# Patient Record
Sex: Male | Born: 1990 | Race: White | Hispanic: No | Marital: Single | Smoking: Never smoker
Health system: Southern US, Community
[De-identification: ages and names within clinical notes are randomized; demographics above are authoritative.]

## PROBLEM LIST (undated history)

## (undated) DIAGNOSIS — F988 Other specified behavioral and emotional disorders with onset usually occurring in childhood and adolescence: Secondary | ICD-10-CM

## (undated) DIAGNOSIS — H3552 Pigmentary retinal dystrophy: Secondary | ICD-10-CM

## (undated) DIAGNOSIS — R7303 Prediabetes: Secondary | ICD-10-CM

## (undated) DIAGNOSIS — Z9989 Dependence on other enabling machines and devices: Secondary | ICD-10-CM

## (undated) DIAGNOSIS — G4730 Sleep apnea, unspecified: Secondary | ICD-10-CM

## (undated) DIAGNOSIS — E669 Obesity, unspecified: Secondary | ICD-10-CM

## (undated) DIAGNOSIS — G4733 Obstructive sleep apnea (adult) (pediatric): Secondary | ICD-10-CM

## (undated) DIAGNOSIS — K760 Fatty (change of) liver, not elsewhere classified: Secondary | ICD-10-CM

## (undated) HISTORY — DX: Pigmentary retinal dystrophy: H35.52

## (undated) HISTORY — DX: Fatty (change of) liver, not elsewhere classified: K76.0

## (undated) HISTORY — PX: TYMPANOSTOMY: SHX2586

## (undated) HISTORY — PX: WRIST FRACTURE SURGERY: SHX121

## (undated) HISTORY — DX: Dependence on other enabling machines and devices: Z99.89

## (undated) HISTORY — DX: Sleep apnea, unspecified: G47.30

## (undated) HISTORY — DX: Other specified behavioral and emotional disorders with onset usually occurring in childhood and adolescence: F98.8

## (undated) HISTORY — DX: Prediabetes: R73.03

## (undated) HISTORY — PX: WISDOM TOOTH EXTRACTION: SHX21

## (undated) HISTORY — DX: Obesity, unspecified: E66.9

## (undated) HISTORY — DX: Obstructive sleep apnea (adult) (pediatric): G47.33

---
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
## 2011-11-04 ENCOUNTER — Other Ambulatory Visit (HOSPITAL_COMMUNITY): Payer: Self-pay | Admitting: Internal Medicine

## 2011-11-07 ENCOUNTER — Ambulatory Visit (HOSPITAL_COMMUNITY)
Admission: RE | Admit: 2011-11-07 | Discharge: 2011-11-07 | Disposition: A | Discharge status: Home / Self Care | Payer: Commercial Managed Care - PPO | Source: Ambulatory Visit | Attending: Internal Medicine | Admitting: Internal Medicine

## 2011-11-07 DIAGNOSIS — R7402 Elevation of levels of lactic acid dehydrogenase (LDH): Secondary | ICD-10-CM | POA: Insufficient documentation

## 2011-11-07 DIAGNOSIS — K7689 Other specified diseases of liver: Secondary | ICD-10-CM | POA: Insufficient documentation

## 2011-11-07 DIAGNOSIS — D739 Disease of spleen, unspecified: Secondary | ICD-10-CM | POA: Insufficient documentation

## 2011-11-07 DIAGNOSIS — R7401 Elevation of levels of liver transaminase levels: Secondary | ICD-10-CM | POA: Insufficient documentation

## 2011-11-08 ENCOUNTER — Other Ambulatory Visit (HOSPITAL_COMMUNITY): Payer: Self-pay | Admitting: Internal Medicine

## 2011-11-08 ENCOUNTER — Ambulatory Visit (HOSPITAL_COMMUNITY): Payer: Self-pay

## 2011-11-08 DIAGNOSIS — C261 Malignant neoplasm of spleen: Secondary | ICD-10-CM

## 2011-12-13 ENCOUNTER — Ambulatory Visit (HOSPITAL_BASED_OUTPATIENT_CLINIC_OR_DEPARTMENT_OTHER): Payer: Commercial Managed Care - PPO

## 2012-04-03 ENCOUNTER — Ambulatory Visit (HOSPITAL_COMMUNITY)
Admission: RE | Admit: 2012-04-03 | Discharge: 2012-04-03 | Disposition: A | Discharge status: Home / Self Care | Payer: Commercial Managed Care - PPO | Source: Ambulatory Visit | Attending: Internal Medicine | Admitting: Internal Medicine

## 2012-04-03 DIAGNOSIS — K7689 Other specified diseases of liver: Secondary | ICD-10-CM | POA: Insufficient documentation

## 2012-04-03 DIAGNOSIS — D739 Disease of spleen, unspecified: Secondary | ICD-10-CM | POA: Insufficient documentation

## 2012-04-15 DIAGNOSIS — G4733 Obstructive sleep apnea (adult) (pediatric): Secondary | ICD-10-CM

## 2012-04-15 HISTORY — DX: Obstructive sleep apnea (adult) (pediatric): G47.33

## 2012-10-25 IMAGING — US US ABDOMEN COMPLETE
1 series · 14 of 25 positions shown · non-contrast
Comparison: None.

CLINICAL DATA: Elevated LFT

ABDOMINAL ULTRASOUND COMPLETE

[Series 1: us abdomen complete · 14 of 78 slices shown]
[im 1/78]
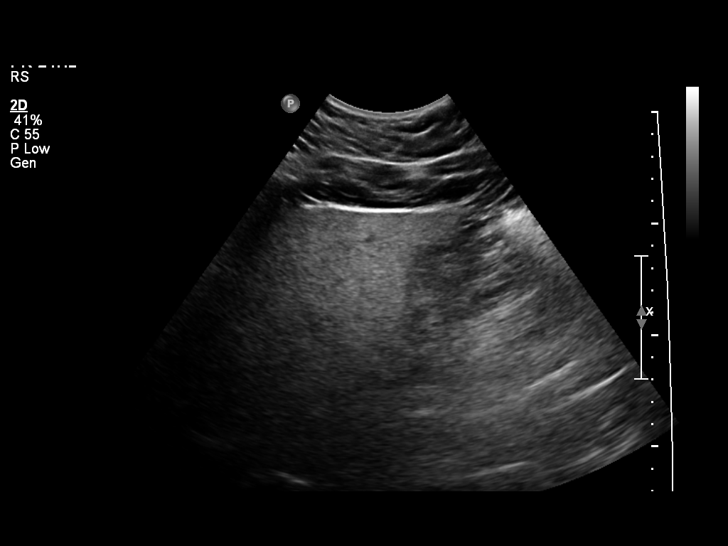
[im 7/78]
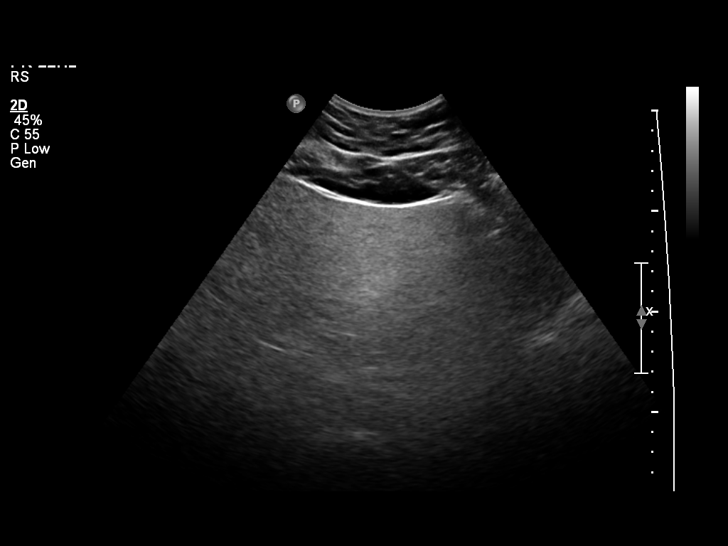
[im 13/78]
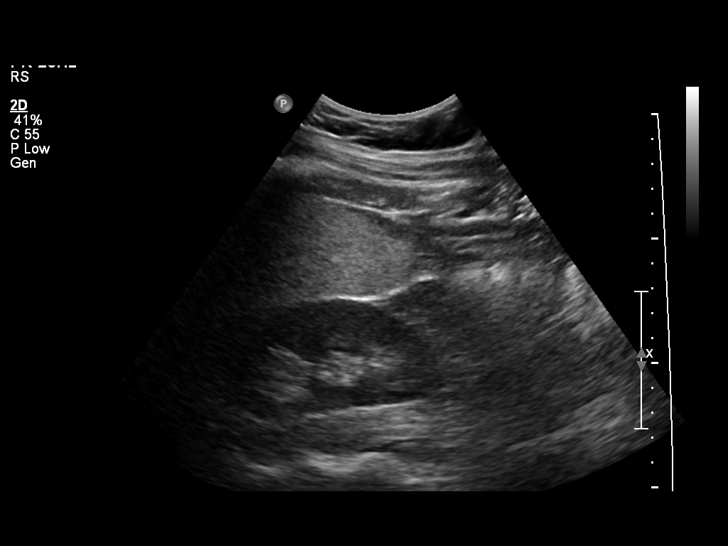
[im 20/78]
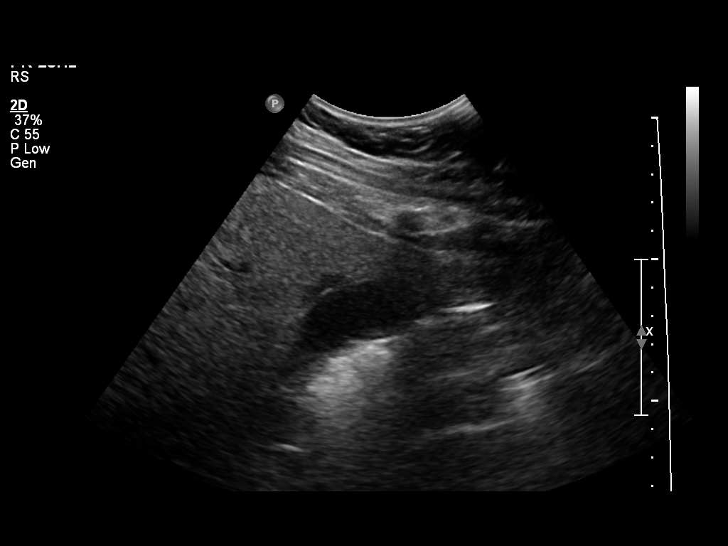
[im 26/78]
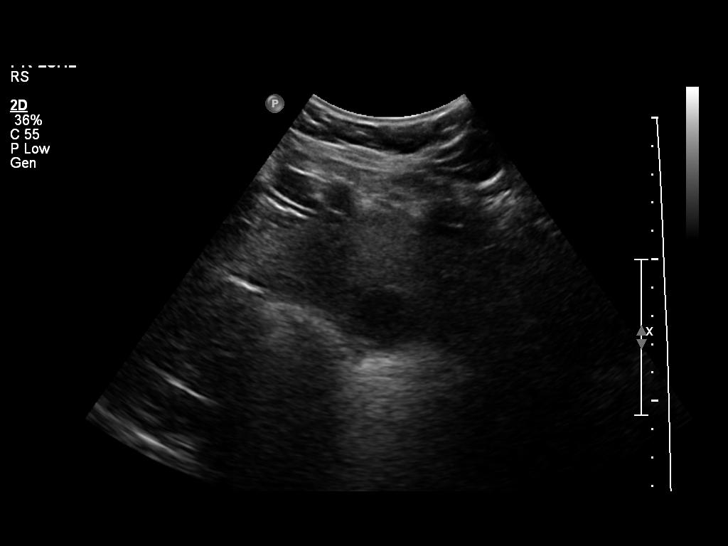
[im 29/78]
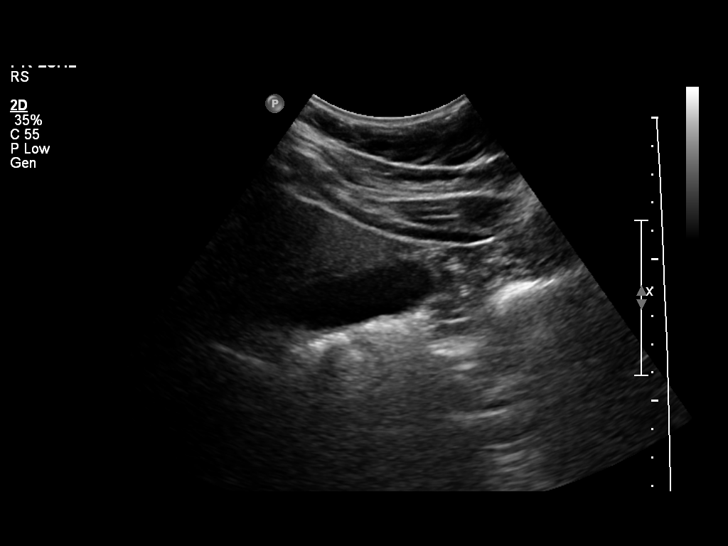
[im 36/78]
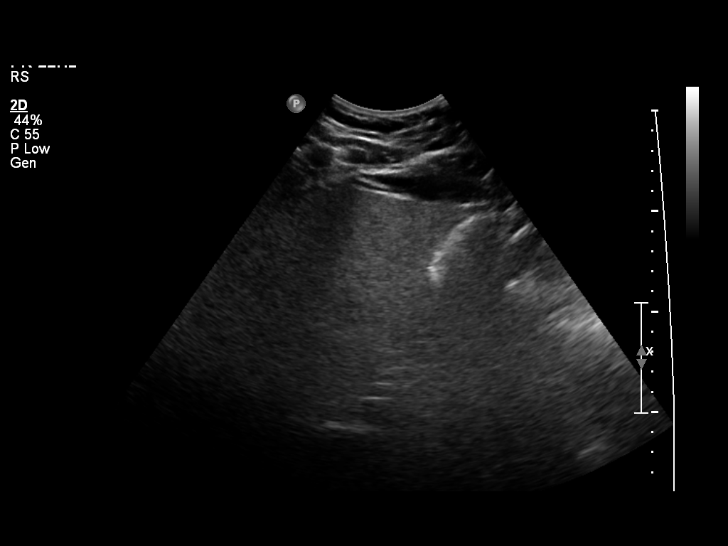
[im 42/78]
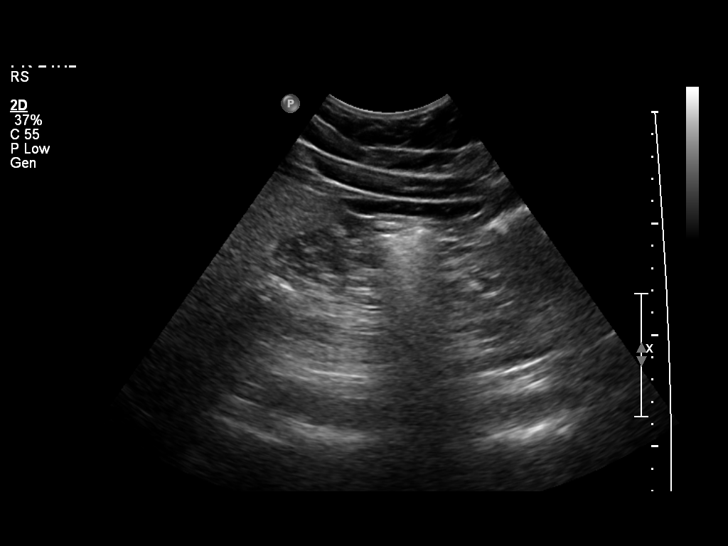
[im 49/78]
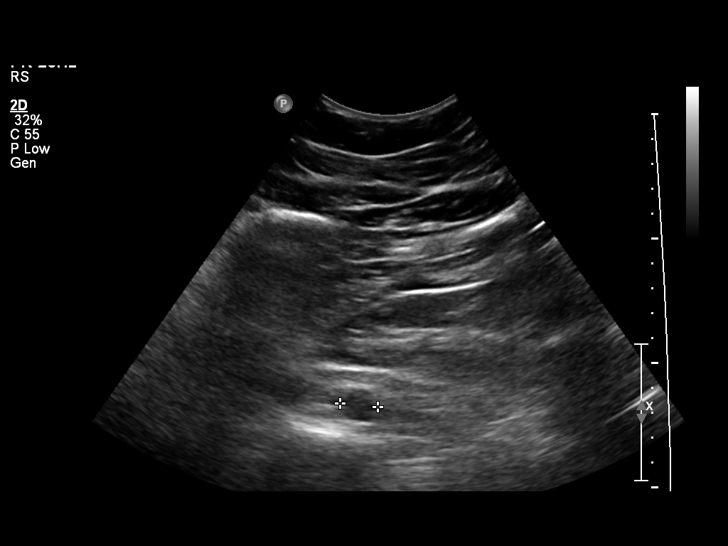
[im 52/78]
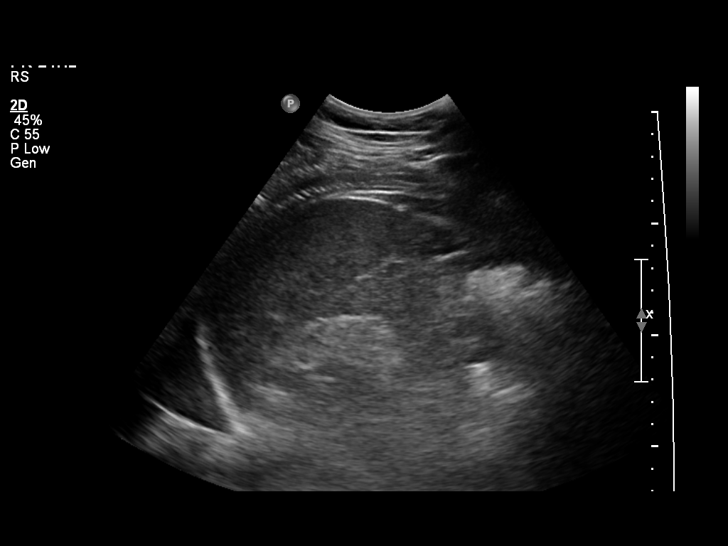
[im 58/78]
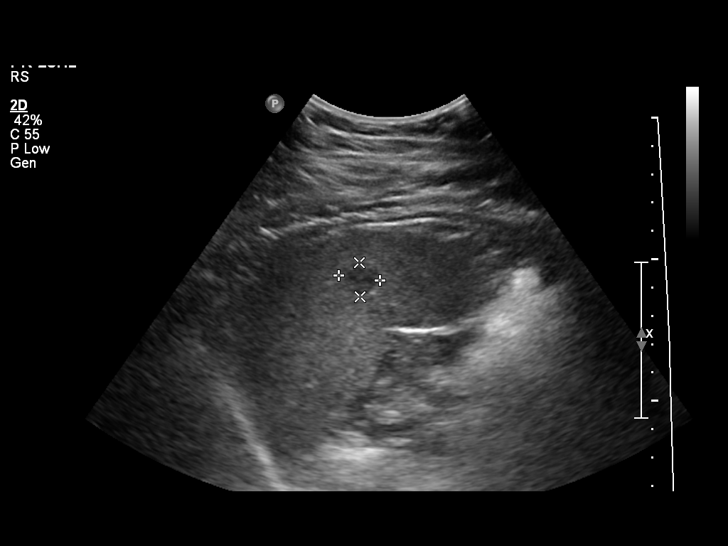
[im 65/78]
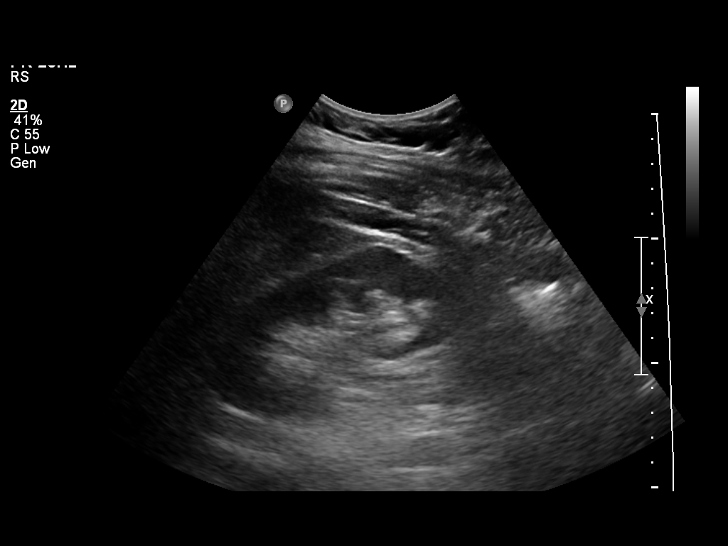
[im 71/78]
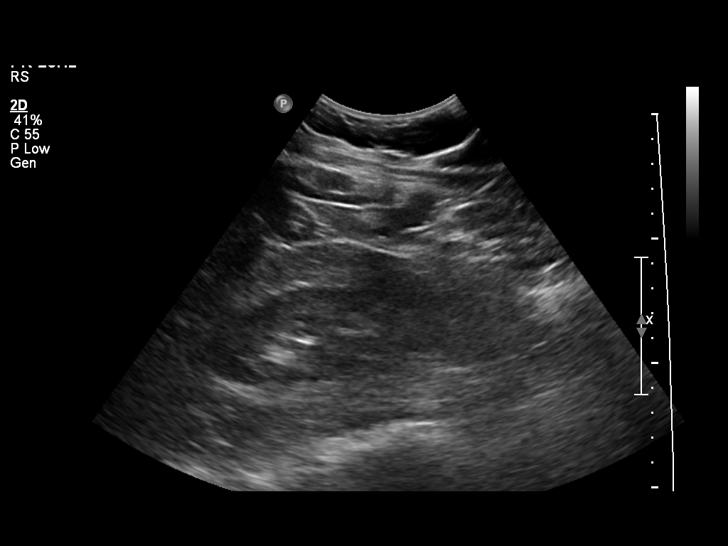
[im 78/78]
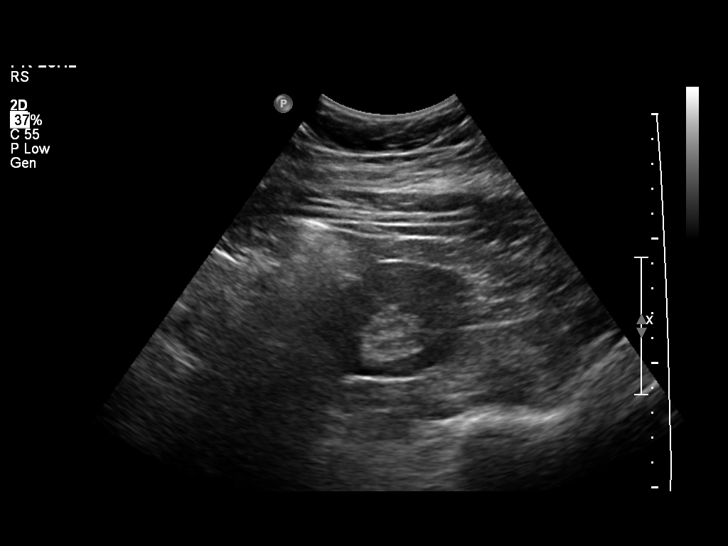

[14 of 25 positions shown; findings below may reference images not displayed]

FINDINGS: Gallbladder:  No gallstones, gallbladder wall thickening, or
pericholecystic fluid.

Common Bile Duct:  Within normal limits in caliber.

Liver: There is diffuse fatty infiltration of the liver.  No focal
mass lesion.

IVC:  Appears normal.

Pancreas:  No abnormality identified.

Spleen:  Measures 11.8 cm.  Indeterminant solid appearing structure
within the splenic parenchyma measures 1.4 x 1.2 cm.

Right kidney:  Normal in size and parenchymal echogenicity.  No
evidence of mass or hydronephrosis.

Left kidney:  Normal in size and parenchymal echogenicity.  No
evidence of mass or hydronephrosis.

Abdominal Aorta:  No aneurysm identified.
IMPRESSION: 1.  No acute findings.
2.  Fatty infiltration the liver.
3.  Indeterminant solid lesion within the splenic parenchyma
measures 1.4 cm.  Solitary splenic lesions are most often benign
lesions.If clinically indicated a more definitive assessment of
this structure may be obtained with contrast enhanced MRI.

## 2013-02-24 ENCOUNTER — Telehealth: Payer: Self-pay | Admitting: *Deleted

## 2013-02-24 NOTE — Telephone Encounter (Signed)
DOB:1990-12-29   RX= RITLIAN LA 30MG  TID PRN  Call when ready , chart being sent back

## 2013-03-08 ENCOUNTER — Encounter: Payer: Self-pay | Admitting: Internal Medicine

## 2013-03-08 DIAGNOSIS — R7303 Prediabetes: Secondary | ICD-10-CM | POA: Insufficient documentation

## 2013-03-08 DIAGNOSIS — E119 Type 2 diabetes mellitus without complications: Secondary | ICD-10-CM | POA: Insufficient documentation

## 2013-03-08 DIAGNOSIS — F988 Other specified behavioral and emotional disorders with onset usually occurring in childhood and adolescence: Secondary | ICD-10-CM | POA: Insufficient documentation

## 2013-03-09 ENCOUNTER — Ambulatory Visit: Payer: Self-pay | Admitting: Emergency Medicine

## 2013-03-22 IMAGING — US US ABDOMEN COMPLETE
1 series · 14 of 25 positions shown · non-contrast
Comparison: 11/07/2011.

CLINICAL DATA: Splenic lesion.

COMPLETE ABDOMINAL ULTRASOUND

[Series 1: us abdomen complete · 14 of 92 slices shown]
[im 1/92]
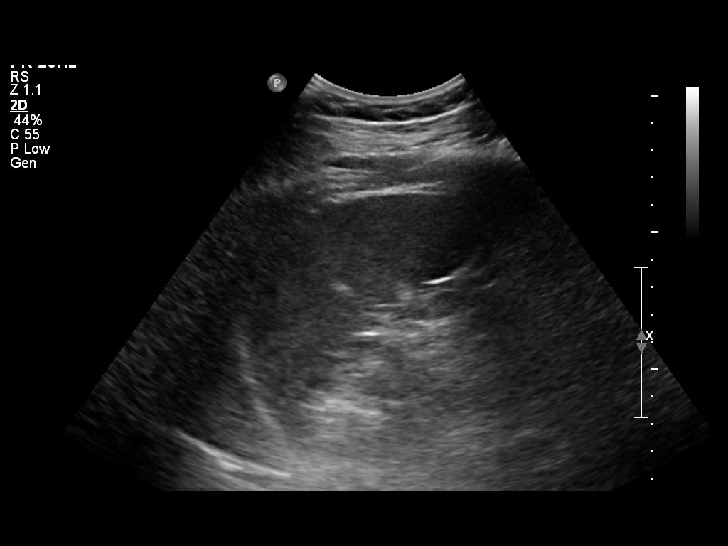
[im 8/92]
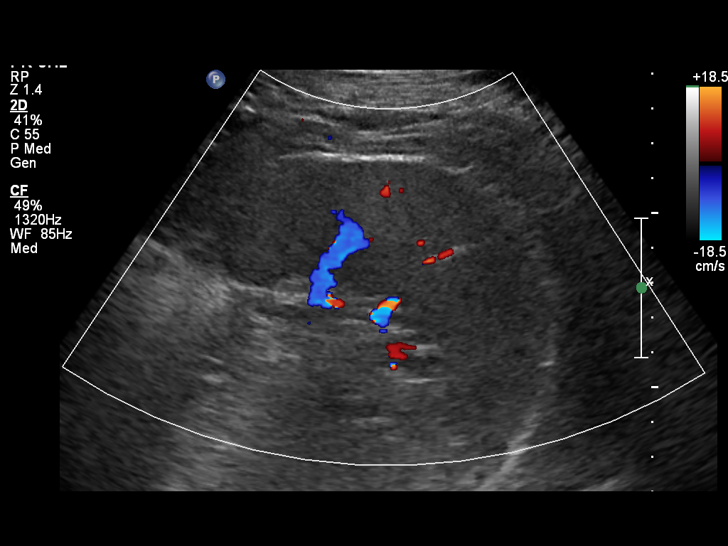
[im 16/92]
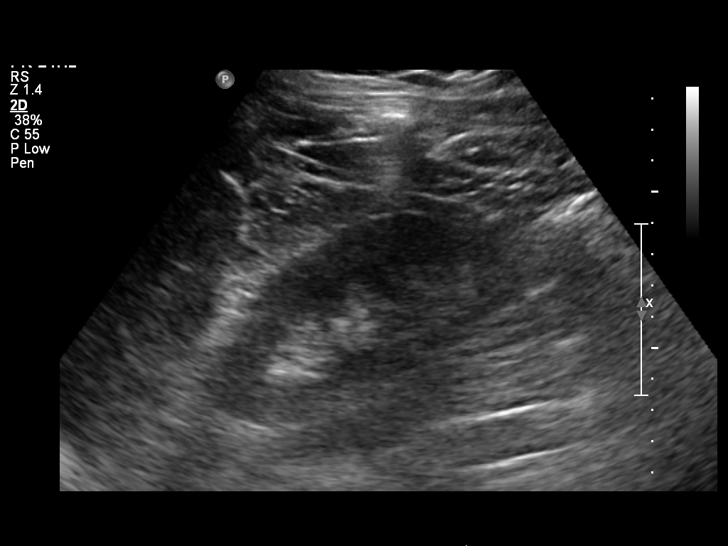
[im 23/92]
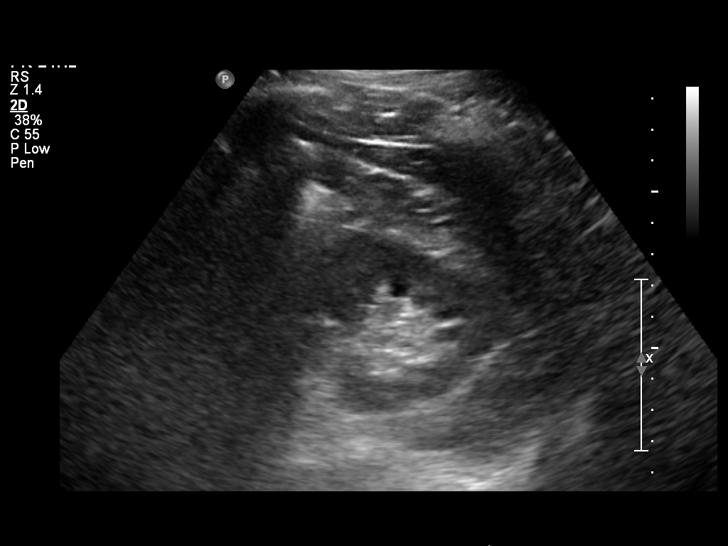
[im 31/92]
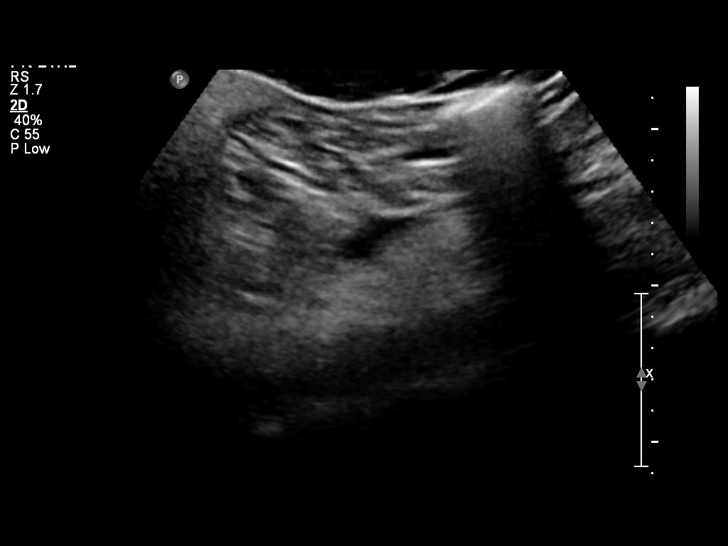
[im 35/92]
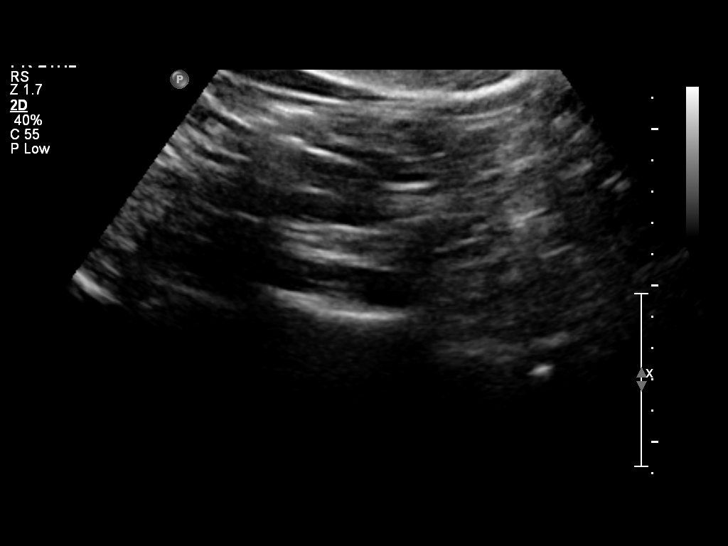
[im 42/92]
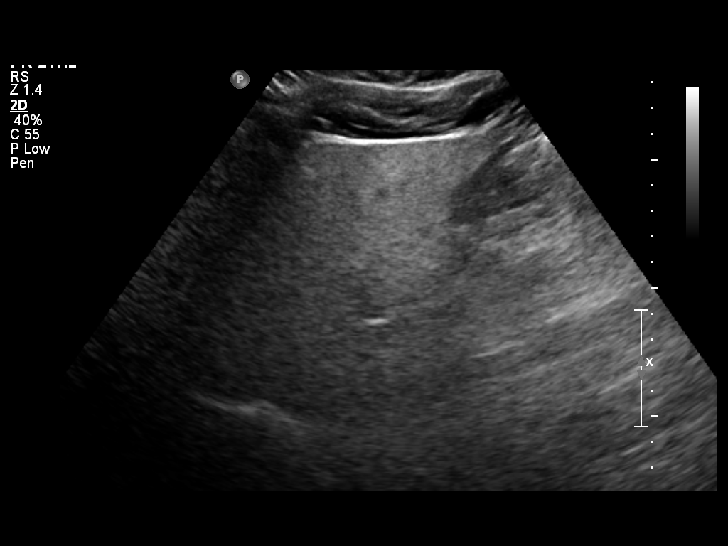
[im 50/92]
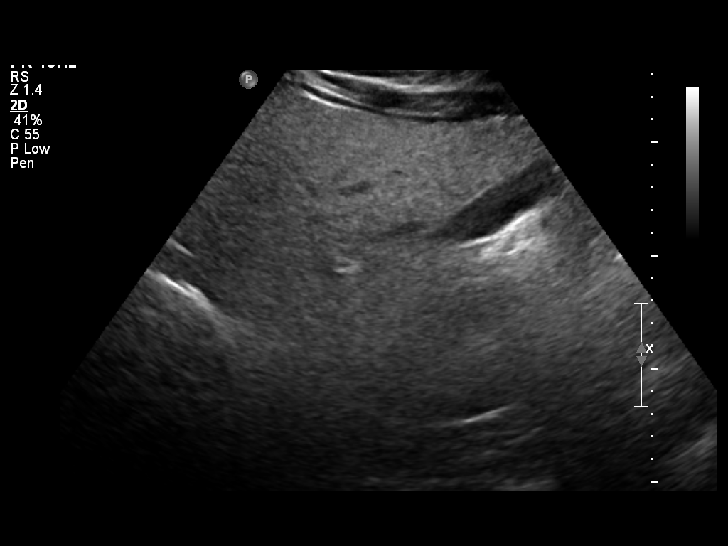
[im 57/92]
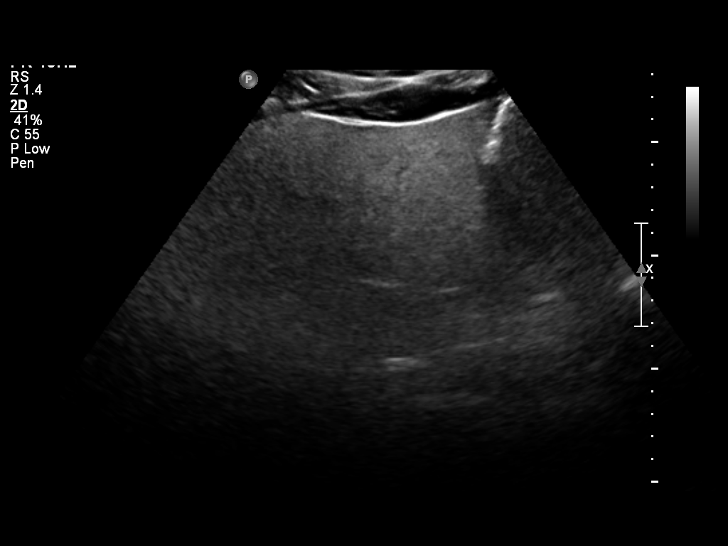
[im 61/92]
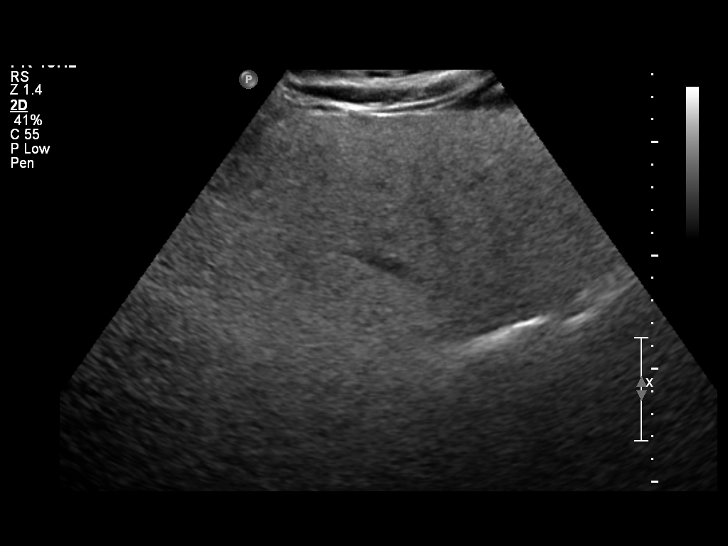
[im 69/92]
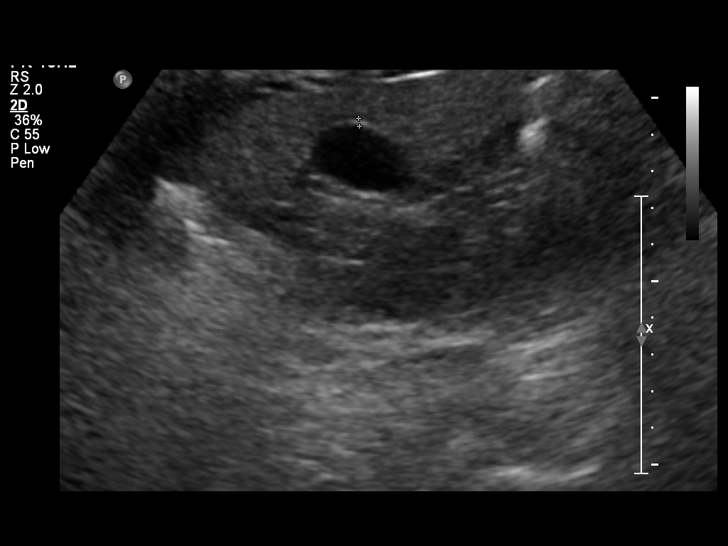
[im 76/92]
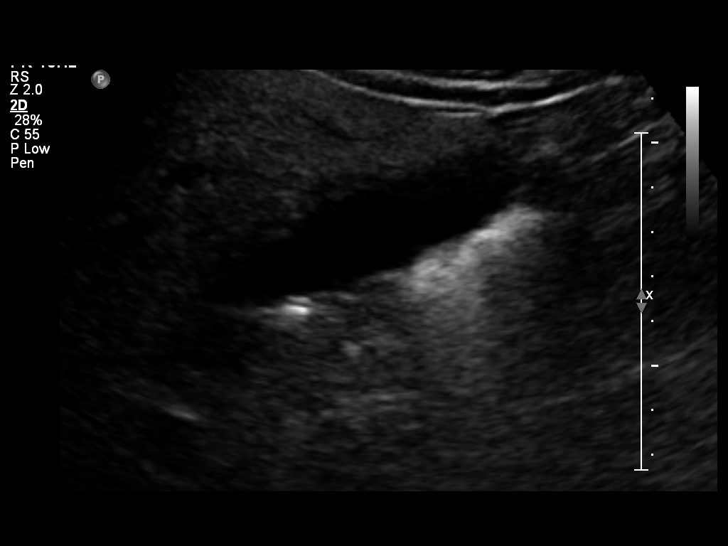
[im 84/92]
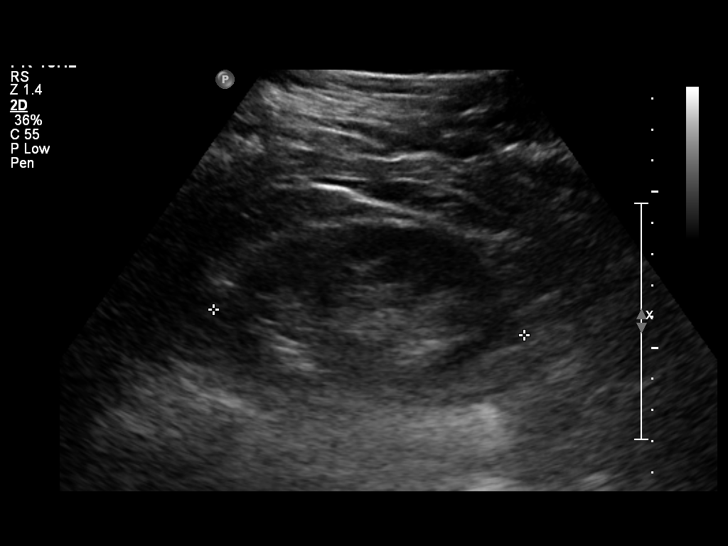
[im 92/92]
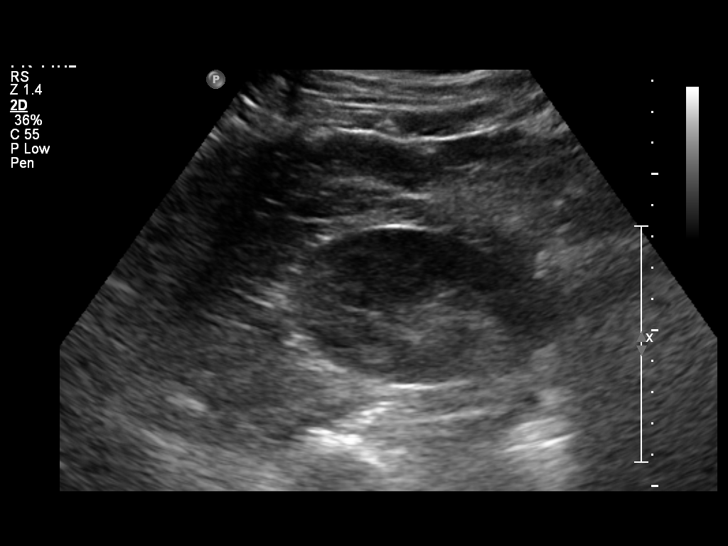

[14 of 25 positions shown; findings below may reference images not displayed]

FINDINGS: Gallbladder:  No gallstones, gallbladder wall thickening, or
pericholecystic fluid.

Common bile duct:  Measures 3 mm, within normal limits.

Liver:  Diffusely increased in echogenicity.  No definite focal
lesions.

IVC:  Appears normal.

Pancreas:  No focal abnormality seen.

Spleen:  Measures 9.5 cm.  Hypoechoic lesion measures 1.4 x 1.1 x
1.7 cm, unchanged.

Right Kidney:  Measures 10.0 cm.  Parenchymal echogenicity is
normal.  No hydronephrosis.  No focal lesions.

Left Kidney:  Measures 10.8 cm.  Parenchymal echogenicity is
normal.  No hydronephrosis.  No focal lesions.

Abdominal aorta:  No aneurysm identified.
IMPRESSION: 1.Hypoechoic splenic lesion is unchanged and likely represent a
hemangioma in a patient of this age.
2.  Hepatic steatosis.

## 2013-09-15 ENCOUNTER — Encounter: Payer: Self-pay | Admitting: Emergency Medicine

## 2013-09-15 ENCOUNTER — Ambulatory Visit (INDEPENDENT_AMBULATORY_CARE_PROVIDER_SITE_OTHER): Payer: BC Managed Care – PPO | Admitting: Emergency Medicine

## 2013-09-15 VITALS — BP 108/72 | HR 86 | Temp 98.2°F | Resp 18 | Ht 67.0 in | Wt 282.0 lb

## 2013-09-15 DIAGNOSIS — K7689 Other specified diseases of liver: Secondary | ICD-10-CM

## 2013-09-15 DIAGNOSIS — H3552 Pigmentary retinal dystrophy: Secondary | ICD-10-CM

## 2013-09-15 DIAGNOSIS — Z111 Encounter for screening for respiratory tuberculosis: Secondary | ICD-10-CM

## 2013-09-15 DIAGNOSIS — Z0000 Encounter for general adult medical examination without abnormal findings: Secondary | ICD-10-CM

## 2013-09-15 DIAGNOSIS — K760 Fatty (change of) liver, not elsewhere classified: Secondary | ICD-10-CM

## 2013-09-15 LAB — CBC WITH DIFFERENTIAL/PLATELET
BASOS ABS: 0 10*3/uL (ref 0.0–0.1)
Basophils Relative: 0 % (ref 0–1)
EOS ABS: 0.2 10*3/uL (ref 0.0–0.7)
EOS PCT: 2 % (ref 0–5)
HEMATOCRIT: 46.8 % (ref 39.0–52.0)
HEMOGLOBIN: 16.1 g/dL (ref 13.0–17.0)
Lymphocytes Relative: 36 % (ref 12–46)
Lymphs Abs: 2.8 10*3/uL (ref 0.7–4.0)
MCH: 28.2 pg (ref 26.0–34.0)
MCHC: 34.4 g/dL (ref 30.0–36.0)
MCV: 82 fL (ref 78.0–100.0)
MONO ABS: 0.5 10*3/uL (ref 0.1–1.0)
MONOS PCT: 6 % (ref 3–12)
NEUTROS ABS: 4.4 10*3/uL (ref 1.7–7.7)
Neutrophils Relative %: 56 % (ref 43–77)
Platelets: 257 10*3/uL (ref 150–400)
RBC: 5.71 MIL/uL (ref 4.22–5.81)
RDW: 14.1 % (ref 11.5–15.5)
WBC: 7.8 10*3/uL (ref 4.0–10.5)

## 2013-09-15 LAB — HEMOGLOBIN A1C
HEMOGLOBIN A1C: 5.7 % — AB (ref <5.7)
Mean Plasma Glucose: 117 mg/dL — ABNORMAL HIGH (ref <117)

## 2013-09-15 MED ORDER — METHYLPHENIDATE HCL 10 MG PO TABS: 10.0000 mg | ORAL_TABLET | Freq: Three times a day (TID) | ORAL | Status: DC

## 2013-09-15 MED ORDER — METHYLPHENIDATE HCL ER (LA) 30 MG PO CP24: 30.0000 mg | ORAL_CAPSULE | Freq: Three times a day (TID) | ORAL | Status: DC

## 2013-09-15 NOTE — Progress Notes (Signed)
Subjective:    Patient ID: Jason Horne, male    DOB: July 13, 1990, 23 y.o.   MRN: [id number]  HPI Comments: 23 yo pleasant OBESE WM CPE. He has a history of elevated LFTs' with [place] ABD 04/03/12 with steatosis. He has gained 7# since last CPE. He is not eating healthy or exercising. He did start CPAP AD and has seen improved sleep. He has not followed upon repeat lab evaluation. He did see EYE doctor and notes stable. He did not start MF AD for obesity and elevated Insulin, nor did he f/u AD. Otherwise he notes he is feeling fine and doing well overall.  He notes he struggled a little with school last semester and will transfer to [employer] for the fall. He is taking his Ritalin AD and denies any SE.     Medication List       This list is accurate as of: 09/15/13 11:59 PM.  Always use your most recent med list.               methylphenidate 30 MG 24 hr capsule  Commonly known as:  RITALIN LA  Take 1 capsule (30 mg total) by mouth 3 (three) times daily.     methylphenidate 10 MG tablet  Commonly known as:  RITALIN  Take 1 tablet (10 mg total) by mouth 3 (three) times daily with meals.      ON CPAP QHS   No Known Allergies  Past Medical History  Diagnosis Date  . Obese   . ADD (attention deficit disorder)   . Retinitis pigmentosa, both eyes   . Pre-diabetes   . Nonalcoholic hepatosteatosis   . OSA on CPAP 2014  . Retinitis pigmentosa 09/16/2013  . Fatty liver 09/16/2013   Past Surgical History  Procedure Laterality Date  . Tympanostomy    . Wrist fracture surgery Left     x 2 with bone graft  . Wisdom tooth extraction     History   Social History  . Marital Status: Single    Spouse Name: N/A    Number of Children: N/A  . Years of Education: N/A   Occupational History  . Not on file.   Social History Main Topics  . Smoking status: Never Smoker   . Smokeless tobacco: Not on file  . Alcohol Use: No  . Drug Use: No  . Sexual Activity: Not on file   Other  Topics Concern  . Not on file   Social History Narrative  . No narrative on file   Family History  Problem Relation Age of Onset  . Adopted: Yes   MAINTENANCE: [id number] stable Dentist:q 6 month  IMMUNIZATIONS: Tdap: 2005 Influenza:  Patient Care Team: Unk Pinto, MD as PCP - General (Internal Medicine) Derry Skill, MD as Consulting Physician (Ophthalmology) Jodi Marble, MD as Consulting Physician (Otolaryngology) [facility]   Review of Systems  All other systems reviewed and are negative.  BP 108/72  Pulse 86  Temp(Src) 98.2 F (36.8 C) (Temporal)  Resp 18  Ht 5\' 7"  (1.702 m)  Wt 282 lb (127.914 kg)  BMI 44.16 kg/m2      Objective:   Physical Exam  Nursing note and vitals reviewed. Constitutional: He is oriented to person, place, and time. He appears well-developed and well-nourished.  obese  HENT:  Head: Normocephalic and atraumatic.  Right Ear: External ear normal.  Left Ear: External ear normal.  Nose: Nose normal.  2 + tonsils bilateral  Eyes: Conjunctivae and EOM are normal. Pupils are equal, round, and reactive to light. Right eye exhibits no discharge. Left eye exhibits no discharge. No scleral icterus.  Neck: Normal range of motion. Neck supple. No JVD present. No tracheal deviation present. No thyromegaly present.  Cardiovascular: Normal rate, regular rhythm, normal heart sounds and intact distal pulses.   Pulmonary/Chest: Effort normal and breath sounds normal.  Abdominal: Soft. Bowel sounds are normal. He exhibits no distension and no mass. There is no tenderness. There is no rebound and no guarding.  Genitourinary:  def  Musculoskeletal: Normal range of motion. He exhibits no edema and no tenderness.  Lymphadenopathy:    He has no cervical adenopathy.  Neurological: He is alert and oriented to person, place, and time. He has normal reflexes. No cranial nerve deficit. He exhibits normal muscle tone. Coordination  normal.  Skin: Skin is warm and dry. No rash noted. No erythema. No pallor.  Toenails thick/ cracking  Psychiatric: He has a normal mood and affect. His behavior is normal. Judgment and thought content normal.          Assessment & Plan:  1. CPE- Update screening labs/ History/ Immunizations/ Testing as needed. Advised healthy diet, QD exercise, increase H20 and continue RX/ Vitamins AD.  2. Elevated LFTs with steatosis HX and obesity- Addressed increased risk and need for improvement ASAP  3. Nail fungus- explained hygiene, w/c if wants referral  4. ADD- Controlled refill RX AD.   5. OSA- continue CPAP, lose weight

## 2013-09-15 NOTE — Patient Instructions (Signed)
Fatty Liver  CAN LEAD to Cirrhosis. Fatty liver is the accumulation of fat in liver cells. It is also called hepatosteatosis or steatohepatitis. It is normal for your liver to contain some fat. If fat is more than 5 to 10% of your liver's weight, you have fatty liver.  There are often no symptoms (problems) for years while damage is still occurring. People often learn about their fatty liver when they have medical tests for other reasons. Fat can damage your liver for years or even decades without causing problems. When it becomes severe, it can cause fatigue, weight loss, weakness, and confusion. This makes you more likely to develop more serious liver problems. The liver is the largest organ in the body. It does a lot of work and often gives no warning signs when it is sick until late in a disease. The liver has many important jobs including:  Breaking down foods.  Storing vitamins, iron, and other minerals.  Making proteins.  Making bile for food digestion.  Breaking down many products including medications, alcohol and some poisons. CAUSES  There are a number of different conditions, medications, and poisons that can cause a fatty liver. Eating too many calories causes fat to build up in the liver. Not processing and breaking fats down normally may also cause this. Certain conditions, such as obesity, diabetes, and high triglycerides also cause this. Most fatty liver patients tend to be middle-aged and over weight.  Some causes of fatty liver are:  Alcohol over consumption.  Malnutrition.  Steroid use.  Valproic acid toxicity.  Obesity.  Cushing's syndrome.  Poisons.  Tetracycline in high dosages.  Pregnancy.  Diabetes.  Hyperlipidemia.  Rapid weight loss. Some people develop fatty liver even having none of these conditions. SYMPTOMS  Fatty liver most often causes no problems. This is called asymptomatic.  It can be diagnosed with blood tests and also by a liver  biopsy.  It is one of the most common causes of minor elevations of liver enzymes on routine blood tests.  Specialized Imaging of the liver using ultrasound, CT (computed tomography) scan, or MRI (magnetic resonance imaging) can suggest a fatty liver but a biopsy is needed to confirm it.  A biopsy involves taking a small sample of liver tissue. This is done by using a needle. It is then looked at under a microscope by a specialist. TREATMENT  It is important to treat the cause. Simple fatty liver without a medical reason may not need treatment.  Weight loss, fat restriction, and exercise in overweight patients produces inconsistent results but is worth trying.  Fatty liver due to alcohol toxicity may not improve even with stopping drinking.  Good control of diabetes may reduce fatty liver.  Lower your triglycerides through diet, medication or both.  Eat a balanced, healthy diet.  Increase your physical activity.  Get regular checkups from a liver specialist.  There are no medical or surgical treatments for a fatty liver or NASH, but improving your diet and increasing your exercise may help prevent or reverse some of the damage. PROGNOSIS  Fatty liver may cause no damage or it can lead to an inflammation of the liver. This is, called steatohepatitis. When it is linked to alcohol abuse, it is called alcoholic steatohepatitis. It often is not linked to alcohol. It is then called nonalcoholic steatohepatitis, or NASH. Over time the liver may become scarred and hardened. This condition is called cirrhosis. Cirrhosis is serious and may lead to liver failure or cancer.  NASH is one of the leading causes of cirrhosis. About 10-20% of Americans have fatty liver and a smaller 2-5% has NASH. Document Released: 05/17/2005 Document Revised: 06/24/2011 Document Reviewed: 07/10/2005 [facility] Patient Information 2014 [place].

## 2013-09-16 ENCOUNTER — Other Ambulatory Visit: Payer: Self-pay | Admitting: Emergency Medicine

## 2013-09-16 ENCOUNTER — Telehealth: Payer: Self-pay | Admitting: Internal Medicine

## 2013-09-16 ENCOUNTER — Encounter: Payer: Self-pay | Admitting: Emergency Medicine

## 2013-09-16 DIAGNOSIS — R6889 Other general symptoms and signs: Secondary | ICD-10-CM

## 2013-09-16 DIAGNOSIS — K760 Fatty (change of) liver, not elsewhere classified: Secondary | ICD-10-CM

## 2013-09-16 DIAGNOSIS — H3552 Pigmentary retinal dystrophy: Secondary | ICD-10-CM | POA: Insufficient documentation

## 2013-09-16 HISTORY — DX: Fatty (change of) liver, not elsewhere classified: K76.0

## 2013-09-16 HISTORY — DX: Pigmentary retinal dystrophy: H35.52

## 2013-09-16 LAB — HEPATIC FUNCTION PANEL
ALK PHOS: 60 U/L (ref 39–117)
ALT: 83 U/L — AB (ref 0–53)
AST: 37 U/L (ref 0–37)
Albumin: 4.6 g/dL (ref 3.5–5.2)
BILIRUBIN INDIRECT: 0.4 mg/dL (ref 0.2–1.2)
Bilirubin, Direct: 0.1 mg/dL (ref 0.0–0.3)
TOTAL PROTEIN: 7 g/dL (ref 6.0–8.3)
Total Bilirubin: 0.5 mg/dL (ref 0.2–1.2)

## 2013-09-16 LAB — URINALYSIS, ROUTINE W REFLEX MICROSCOPIC
Bilirubin Urine: NEGATIVE
GLUCOSE, UA: NEGATIVE mg/dL
HGB URINE DIPSTICK: NEGATIVE
Ketones, ur: NEGATIVE mg/dL
Leukocytes, UA: NEGATIVE
NITRITE: NEGATIVE
Protein, ur: NEGATIVE mg/dL
SPECIFIC GRAVITY, URINE: 1.028 (ref 1.005–1.030)
Urobilinogen, UA: 0.2 mg/dL (ref 0.0–1.0)
pH: 5.5 (ref 5.0–8.0)

## 2013-09-16 LAB — LIPID PANEL
Cholesterol: 172 mg/dL (ref 0–200)
HDL: 42 mg/dL (ref >39)
LDL CALC: 116 mg/dL — AB (ref 0–99)
Total CHOL/HDL Ratio: 4.1 Ratio
Triglycerides: 72 mg/dL (ref <150)
VLDL: 14 mg/dL (ref 0–40)

## 2013-09-16 LAB — MAGNESIUM: Magnesium: 2.1 mg/dL (ref 1.5–2.5)

## 2013-09-16 LAB — BASIC METABOLIC PANEL WITH GFR
BUN: 14 mg/dL (ref 6–23)
CALCIUM: 9.8 mg/dL (ref 8.4–10.5)
CO2: 27 mEq/L (ref 19–32)
Chloride: 103 mEq/L (ref 96–112)
Creat: 1.22 mg/dL (ref 0.50–1.35)
GFR, Est Non African American: 84 mL/min
Glucose, Bld: 93 mg/dL (ref 70–99)
POTASSIUM: 4.5 meq/L (ref 3.5–5.3)
SODIUM: 141 meq/L (ref 135–145)

## 2013-09-16 LAB — MICROALBUMIN / CREATININE URINE RATIO
CREATININE, URINE: 431 mg/dL
MICROALB UR: 1.39 mg/dL (ref 0.00–1.89)
MICROALB/CREAT RATIO: 3.2 mg/g (ref 0.0–30.0)

## 2013-09-16 LAB — INSULIN, FASTING: Insulin fasting, serum: 63 u[IU]/mL — ABNORMAL HIGH (ref 3–28)

## 2013-09-16 LAB — TSH: TSH: 3.885 u[IU]/mL (ref 0.350–4.500)

## 2013-09-16 LAB — VITAMIN D 25 HYDROXY (VIT D DEFICIENCY, FRACTURES): VIT D 25 HYDROXY: 33 ng/mL (ref 30–89)

## 2013-09-16 MED ORDER — METFORMIN HCL ER 500 MG PO TB24: 500.0000 mg | ORAL_TABLET | Freq: Every day | ORAL | Status: DC

## 2013-09-16 NOTE — Telephone Encounter (Signed)
Patient called in to sign up for My Chart.  Please web release labs, patient wanted a copy of them.  Thank you, Katrina Judeth Horn [facility] [phone] Fax [phone]

## 2013-09-17 LAB — TB SKIN TEST
INDURATION: 0 mm
TB Skin Test: NEGATIVE

## 2013-09-20 NOTE — Telephone Encounter (Signed)
Patient has not set up MyChart yet, I mailed copy of labs to patient per his request.

## 2013-09-22 ENCOUNTER — Other Ambulatory Visit: Payer: Self-pay | Admitting: Emergency Medicine

## 2013-09-22 ENCOUNTER — Ambulatory Visit
Admission: RE | Admit: 2013-09-22 | Discharge: 2013-09-22 | Disposition: A | Discharge status: Home / Self Care | Payer: BC Managed Care – PPO | Source: Ambulatory Visit | Attending: Emergency Medicine | Admitting: Emergency Medicine

## 2013-09-22 DIAGNOSIS — K760 Fatty (change of) liver, not elsewhere classified: Secondary | ICD-10-CM

## 2013-09-22 DIAGNOSIS — R6889 Other general symptoms and signs: Secondary | ICD-10-CM

## 2013-09-23 ENCOUNTER — Encounter: Payer: Self-pay | Admitting: Gastroenterology

## 2013-10-18 ENCOUNTER — Ambulatory Visit: Payer: Commercial Managed Care - PPO | Admitting: Gastroenterology

## 2013-10-26 ENCOUNTER — Ambulatory Visit: Payer: Self-pay | Admitting: Emergency Medicine

## 2013-10-28 ENCOUNTER — Encounter: Payer: Self-pay | Admitting: Emergency Medicine

## 2013-11-25 ENCOUNTER — Ambulatory Visit: Payer: Self-pay | Admitting: Physician Assistant

## 2013-12-13 ENCOUNTER — Ambulatory Visit (INDEPENDENT_AMBULATORY_CARE_PROVIDER_SITE_OTHER): Payer: BC Managed Care – PPO | Admitting: Emergency Medicine

## 2013-12-13 ENCOUNTER — Encounter: Payer: Self-pay | Admitting: Emergency Medicine

## 2013-12-13 VITALS — BP 136/96 | HR 94 | Temp 98.2°F | Resp 16 | Ht 67.0 in | Wt 286.0 lb

## 2013-12-13 DIAGNOSIS — R030 Elevated blood-pressure reading, without diagnosis of hypertension: Secondary | ICD-10-CM

## 2013-12-13 DIAGNOSIS — E782 Mixed hyperlipidemia: Secondary | ICD-10-CM

## 2013-12-13 DIAGNOSIS — R7309 Other abnormal glucose: Secondary | ICD-10-CM

## 2013-12-13 DIAGNOSIS — Z23 Encounter for immunization: Secondary | ICD-10-CM

## 2013-12-13 LAB — HEMOGLOBIN A1C
HEMOGLOBIN A1C: 5.8 % — AB (ref <5.7)
MEAN PLASMA GLUCOSE: 120 mg/dL — AB (ref <117)

## 2013-12-13 NOTE — Patient Instructions (Signed)
Fatty Liver Fatty liver is the accumulation of fat in liver cells. It is also called hepatosteatosis or steatohepatitis. It is normal for your liver to contain some fat. If fat is more than 5 to 10% of your liver's weight, you have fatty liver.  There are often no symptoms (problems) for years while damage is still occurring. People often learn about their fatty liver when they have medical tests for other reasons. Fat can damage your liver for years or even decades without causing problems. When it becomes severe, it can cause fatigue, weight loss, weakness, and confusion. This makes you more likely to develop more serious liver problems. The liver is the largest organ in the body. It does a lot of work and often gives no warning signs when it is sick until late in a disease. The liver has many important jobs including:  Breaking down foods.  Storing vitamins, iron, and other minerals.  Making proteins.  Making bile for food digestion.  Breaking down many products including medications, alcohol and some poisons. CAUSES  There are a number of different conditions, medications, and poisons that can cause a fatty liver. Eating too many calories causes fat to build up in the liver. Not processing and breaking fats down normally may also cause this. Certain conditions, such as obesity, diabetes, and high triglycerides also cause this. Most fatty liver patients tend to be middle-aged and over weight.  Some causes of fatty liver are:  Alcohol over consumption.  Malnutrition.  Steroid use.  Valproic acid toxicity.  Obesity.  Cushing's syndrome.  Poisons.  Tetracycline in high dosages.  Pregnancy.  Diabetes.  Hyperlipidemia.  Rapid weight loss. Some people develop fatty liver even having none of these conditions. SYMPTOMS  Fatty liver most often causes no problems. This is called asymptomatic.  It can be diagnosed with blood tests and also by a liver biopsy.  It is one of  the most common causes of minor elevations of liver enzymes on routine blood tests.  Specialized Imaging of the liver using ultrasound, CT (computed tomography) scan, or MRI (magnetic resonance imaging) can suggest a fatty liver but a biopsy is needed to confirm it.  A biopsy involves taking a small sample of liver tissue. This is done by using a needle. It is then looked at under a microscope by a specialist. TREATMENT  It is important to treat the cause. Simple fatty liver without a medical reason may not need treatment.  Weight loss, fat restriction, and exercise in overweight patients produces inconsistent results but is worth trying.  Fatty liver due to alcohol toxicity may not improve even with stopping drinking.  Good control of diabetes may reduce fatty liver.  Lower your triglycerides through diet, medication or both.  Eat a balanced, healthy diet.  Increase your physical activity.  Get regular checkups from a liver specialist.  There are no medical or surgical treatments for a fatty liver or NASH, but improving your diet and increasing your exercise may help prevent or reverse some of the damage. PROGNOSIS  Fatty liver may cause no damage or it can lead to an inflammation of the liver. This is, called steatohepatitis. When it is linked to alcohol abuse, it is called alcoholic steatohepatitis. It often is not linked to alcohol. It is then called nonalcoholic steatohepatitis, or NASH. Over time the liver may become scarred and hardened. This condition is called cirrhosis. Cirrhosis is serious and may lead to liver failure or cancer. NASH is one of  the leading causes of cirrhosis. About 10-20% of Americans have fatty liver and a smaller 2-5% has NASH. Document Released: 05/17/2005 Document Revised: 06/24/2011 Document Reviewed: 08/11/2013 [facility] Patient Information 2015 [place], [place]. This information is not intended to replace advice given to you by your health care provider.  Make sure you discuss any questions you have with your health care provider. Cholesterol Cholesterol is a fat. Your body needs a small amount of cholesterol. Cholesterol may build up in your blood vessels. This increases your chance of having a heart attack or stroke. You cannot feel your cholesterol levels. The only way to know your cholesterol level is high is with a blood test. Keep your test results. Work with your doctor to keep your cholesterol at a good level. WHAT DO THE TEST RESULTS MEAN?  Total cholesterol is how much cholesterol is in your blood.  LDL is bad cholesterol. This is the type that can build up. You want LDL to be low.  HDL is good cholesterol. It cleans your blood vessels and carries LDL away. You want HDL to be high.  Triglycerides are fat that the body can burn for energy or store. WHAT ARE GOOD LEVELS OF CHOLESTEROL?  Total cholesterol below 200.  LDL below 100 for people at risk. Below 70 for those at very high risk.  HDL above 50 is good. Above 60 is best.  Triglycerides below 150. HOW CAN I LOWER MY CHOLESTEROL?  Diet. Follow your diet programs as told by your doctor.  Choose fish, white meat chicken, roasted [place], or baked [place]. Try not to eat red meat, fried foods, or processed meats such as sausage and lunch meats.  Eat lots of fresh fruits and vegetables.  Choose whole grains, beans, pasta, potatoes, and cereals.  Use only small amounts of olive, corn, or canola oils.  Try not to eat butter, mayonnaise, shortening, or palm kernel oils.  Try not to eat foods with trans fats.  Drink skim or nonfat milk. Eat low-fat or nonfat yogurt and cheeses. Try not to drink whole milk or cream. Try not to eat ice cream, egg yolks, and full-fat cheeses.  Healthy desserts include angel food cake, ginger snaps, animal crackers, hard candy, popsicles, and low-fat or nonfat frozen yogurt. Try not to eat pastries, cakes, pies, and cookies.  Exercise. Follow  your exercise programs as told by your doctor.  Be more active. You can try gardening, walking, or taking the stairs. Ask your doctor about how you can be more active.  Medicine. Take medicine as told by your doctor. Document Released: 06/28/2008 Document Revised: 08/16/2013 Document Reviewed: 01/13/2013 [facility] Patient Information 2015 [place], [place]. This information is not intended to replace advice given to you by your health care provider. Make sure you discuss any questions you have with your health care provider.

## 2013-12-13 NOTE — Progress Notes (Signed)
Subjective:    Patient ID: Jason Horne, male    DOB: [birth year], 23 y.o.   MRN: [id number]  HPI Comments: 23 yo WM close f/u for abnormal labs. Jason Horne is not exercising. Jason Horne is trying to improve diet and lose weight. Jason Horne has had fatty liver on U/S but did not have GI f/u evaluation due to wanting to try to improve health with diet/ weight loss. Jason Horne denies any abdominal pain. His last BP was WNL and denies HTN hx.   WBC             7.8   09/15/2013 HGB            16.1   09/15/2013 HCT            46.8   09/15/2013 PLT             257   09/15/2013 GLUCOSE          93   09/15/2013 CHOL            172   09/15/2013 TRIG             72   09/15/2013 HDL              42   09/15/2013 LDLCALC         116   09/15/2013 ALT              83   09/15/2013 AST              37   09/15/2013 NA              141   09/15/2013 K               4.5   09/15/2013 CL              103   09/15/2013 CREATININE     1.22   09/15/2013 BUN              14   09/15/2013 CO2              27   09/15/2013 TSH           3.885   09/15/2013 HGBA1C          5.7   09/15/2013 MICROALBUR     1.39   09/15/2013     Medication List       This list is accurate as of: 12/13/13  4:09 PM.  Always use your most recent med list.               metFORMIN 500 MG 24 hr tablet  Commonly known as:  GLUCOPHAGE XR  Take 1 tablet (500 mg total) by mouth daily with breakfast.     methylphenidate 30 MG 24 hr capsule  Commonly known as:  RITALIN LA  Take 1 capsule (30 mg total) by mouth 3 (three) times daily.     methylphenidate 10 MG tablet  Commonly known as:  RITALIN  Take 1 tablet (10 mg total) by mouth 3 (three) times daily with meals.       No Known Allergies  Past Medical History  Diagnosis Date  . Obese   . ADD (attention deficit disorder)   . Retinitis pigmentosa, both eyes   . Pre-diabetes   . Nonalcoholic hepatosteatosis   . OSA on CPAP 2014  . Retinitis pigmentosa 09/16/2013  . Fatty liver 09/16/2013      Review of  Systems  All other systems  reviewed and are negative.  BP 136/96  Pulse 94  Temp(Src) 98.2 F (36.8 C) (Temporal)  Resp 16  Ht 5\' 7"  (1.702 m)  Wt 286 lb (129.729 kg)  BMI 44.78 kg/m2     Objective:   Physical Exam  Nursing note and vitals reviewed. Constitutional: Jason Horne is oriented to person, place, and time. Jason Horne appears well-developed and well-nourished.  OBESE  HENT:  Head: Normocephalic and atraumatic.  Right Ear: External ear normal.  Left Ear: External ear normal.  Nose: Nose normal.  Eyes: Conjunctivae and EOM are normal. No scleral icterus.  Neck: Normal range of motion. Neck supple. No JVD present. No thyromegaly present.  Cardiovascular: Normal rate, regular rhythm, normal heart sounds and intact distal pulses.   Pulmonary/Chest: Effort normal and breath sounds normal.  Abdominal: Soft. Bowel sounds are normal. Jason Horne exhibits no distension and no mass. There is no tenderness.  Musculoskeletal: Normal range of motion. Jason Horne exhibits no edema and no tenderness.  Lymphadenopathy:    Jason Horne has no cervical adenopathy.  Neurological: Jason Horne is alert and oriented to person, place, and time. No cranial nerve deficit. Coordination normal.  Skin: Skin is warm and dry.  Psychiatric: Jason Horne has a normal mood and affect. His behavior is normal. Judgment and thought content normal.          Assessment & Plan:  1. Elevated BP w/o HTN- Check BP call if >130/80, increase cardio  2. 3 month F/U for elevated LFT, Cholesterol, Pre-Dm, D. Deficient. Needs healthy diet, cardio QD and obtain healthy weight. Check Labs. If continued elevation needs referral to GI  3. Obesity- Continue weight loss, increase activity and better diet. Pt aware of risks. Check labs

## 2013-12-14 ENCOUNTER — Other Ambulatory Visit: Payer: Self-pay | Admitting: Emergency Medicine

## 2013-12-14 DIAGNOSIS — R7401 Elevation of levels of liver transaminase levels: Secondary | ICD-10-CM

## 2013-12-14 DIAGNOSIS — R740 Nonspecific elevation of levels of transaminase and lactic acid dehydrogenase [LDH]: Principal | ICD-10-CM

## 2013-12-14 LAB — INSULIN, FASTING: INSULIN FASTING, SERUM: 244.2 u[IU]/mL — AB (ref 2.0–19.6)

## 2013-12-14 LAB — BASIC METABOLIC PANEL WITH GFR
BUN: 8 mg/dL (ref 6–23)
CALCIUM: 9.7 mg/dL (ref 8.4–10.5)
CO2: 25 meq/L (ref 19–32)
CREATININE: 1.2 mg/dL (ref 0.50–1.35)
Chloride: 104 mEq/L (ref 96–112)
GFR, Est African American: >89 mL/min
GFR, Est Non African American: 85 mL/min
Glucose, Bld: 103 mg/dL — ABNORMAL HIGH (ref 70–99)
Potassium: 4.2 mEq/L (ref 3.5–5.3)
Sodium: 142 mEq/L (ref 135–145)

## 2013-12-14 LAB — HEPATIC FUNCTION PANEL
ALBUMIN: 4.5 g/dL (ref 3.5–5.2)
ALT: 101 U/L — AB (ref 0–53)
AST: 43 U/L — ABNORMAL HIGH (ref 0–37)
Alkaline Phosphatase: 56 U/L (ref 39–117)
BILIRUBIN INDIRECT: 0.3 mg/dL (ref 0.2–1.2)
Bilirubin, Direct: 0.1 mg/dL (ref 0.0–0.3)
TOTAL PROTEIN: 6.7 g/dL (ref 6.0–8.3)
Total Bilirubin: 0.4 mg/dL (ref 0.2–1.2)

## 2013-12-14 LAB — LIPID PANEL
Cholesterol: 140 mg/dL (ref 0–200)
HDL: 39 mg/dL — AB (ref >39)
LDL CALC: 75 mg/dL (ref 0–99)
TRIGLYCERIDES: 130 mg/dL (ref <150)
Total CHOL/HDL Ratio: 3.6 Ratio
VLDL: 26 mg/dL (ref 0–40)

## 2013-12-17 ENCOUNTER — Encounter: Payer: Self-pay | Admitting: Internal Medicine

## 2014-02-01 ENCOUNTER — Other Ambulatory Visit: Payer: Self-pay | Admitting: *Deleted

## 2014-02-01 MED ORDER — METFORMIN HCL ER 500 MG PO TB24
500.0000 mg | ORAL_TABLET | Freq: Every day | ORAL | Status: AC
Start: 2014-02-01 — End: 2015-02-01

## 2014-02-10 ENCOUNTER — Other Ambulatory Visit: Payer: Self-pay | Admitting: Internal Medicine

## 2014-02-10 MED ORDER — METHYLPHENIDATE HCL 10 MG PO TABS: ORAL_TABLET | ORAL | Status: DC

## 2014-02-10 MED ORDER — METHYLPHENIDATE HCL ER (LA) 30 MG PO CP24: ORAL_CAPSULE | ORAL | Status: DC

## 2014-02-11 ENCOUNTER — Telehealth: Payer: Self-pay | Admitting: *Deleted

## 2014-02-11 NOTE — Telephone Encounter (Signed)
Left message to pick up Ritalin RX.

## 2014-02-14 ENCOUNTER — Other Ambulatory Visit: Payer: Self-pay | Admitting: Internal Medicine

## 2014-02-14 DIAGNOSIS — F988 Other specified behavioral and emotional disorders with onset usually occurring in childhood and adolescence: Secondary | ICD-10-CM

## 2014-02-21 ENCOUNTER — Ambulatory Visit: Payer: Commercial Managed Care - PPO | Admitting: Internal Medicine

## 2014-02-21 ENCOUNTER — Telehealth: Payer: Self-pay | Admitting: Internal Medicine

## 2014-02-21 NOTE — Telephone Encounter (Signed)
No charge. 

## 2014-04-21 ENCOUNTER — Other Ambulatory Visit (INDEPENDENT_AMBULATORY_CARE_PROVIDER_SITE_OTHER): Payer: BLUE CROSS/BLUE SHIELD

## 2014-04-21 ENCOUNTER — Ambulatory Visit (INDEPENDENT_AMBULATORY_CARE_PROVIDER_SITE_OTHER): Payer: BLUE CROSS/BLUE SHIELD | Admitting: Internal Medicine

## 2014-04-21 ENCOUNTER — Encounter: Payer: Self-pay | Admitting: Internal Medicine

## 2014-04-21 VITALS — BP 132/72 | HR 96 | Ht 67.0 in | Wt 292.1 lb

## 2014-04-21 DIAGNOSIS — K760 Fatty (change of) liver, not elsewhere classified: Secondary | ICD-10-CM

## 2014-04-21 LAB — HEPATIC FUNCTION PANEL
ALK PHOS: 57 U/L (ref 39–117)
ALT: 67 U/L — AB (ref 0–53)
AST: 39 U/L — ABNORMAL HIGH (ref 0–37)
Albumin: 4.3 g/dL (ref 3.5–5.2)
Bilirubin, Direct: 0 mg/dL (ref 0.0–0.3)
TOTAL PROTEIN: 7.3 g/dL (ref 6.0–8.3)
Total Bilirubin: 0.4 mg/dL (ref 0.2–1.2)

## 2014-04-21 LAB — FERRITIN: Ferritin: 101.6 ng/mL (ref 22.0–322.0)

## 2014-04-21 NOTE — Patient Instructions (Signed)
Your physician has requested that you go to the basement for the lab work before leaving today.  If you would like to pursue weight loss options call [facility] to get signed up for one of their classes.  Phone # [phone].   I appreciate the opportunity to care for you. Silvano Rusk, M.D., [facility]

## 2014-04-21 NOTE — Assessment & Plan Note (Signed)
If she could lose a large amount of weight that would help him in many ways. I think he actually might qualify for bariatric surgery given that he has sleep apnea and his morbid obesity and the fatty liver perhaps. I'm not sure, I don't know how interest he is in this, but he will be given the phone number to the surgery clinic to call if desired.

## 2014-04-21 NOTE — Assessment & Plan Note (Addendum)
LFT, ceruloplasmin, ANA, HCV ab, Hep B S ag and Hep core Ab total, ferritin is to exclude other inherited or acquired conditions that could have bearing on this. Weight loss if possible, he realizes that is the only true treatment for fatty liver. Bariatric options discussed and he will be given the phone number to the bariatric surgery clinic.

## 2014-04-21 NOTE — Progress Notes (Signed)
Referred by Kelby Aline PA-C Subjective:    Patient ID: Jason Horne, male    DOB: 1990/05/26, 24 y.o.   MRN: [id number]  HPI The patient is a very nice young white man with a history of abnormal transaminases and fatty liver by ultrasound. Does not have any GI symptoms or liver disease risk factors such as multiple sex partners, alcohol, or use of needles. He has had 3 ultrasounds is 2013, all showing fatty liver, the first 2 done to confirm stability of splenic lesions that were thought to be benign.  GI review of systems is otherwise negative. No Known Allergies Outpatient Prescriptions Prior to Visit  Medication Sig Dispense Refill  . metFORMIN (GLUCOPHAGE XR) 500 MG 24 hr tablet Take 1 tablet (500 mg total) by mouth daily with breakfast. 90 tablet 3  . methylphenidate (RITALIN LA) 30 MG 24 hr capsule Take 1 capsule each morning if needed for focusing and concentration 30 capsule 0  . methylphenidate (RITALIN) 10 MG tablet Take 1 tablet daily if needed for focusing & concentration 30 tablet 0   No facility-administered medications prior to visit.   Past Medical History  Diagnosis Date  . Obese   . ADD (attention deficit disorder)   . Retinitis pigmentosa, both eyes   . Pre-diabetes   . OSA on CPAP 2014  . Retinitis pigmentosa 09/16/2013  . Fatty liver 09/16/2013  . Sleep apnea    Past Surgical History  Procedure Laterality Date  . Tympanostomy    . Wrist fracture surgery Left     x 2 with bone graft  . Wisdom tooth extraction     History   Social History  . Marital Status: Single    Spouse Name: N/A    Number of Children: 0  . Years of Education: N/A   Occupational History  . College student    Social History Main Topics  . Smoking status: Never Smoker   . Smokeless tobacco: Never Used  . Alcohol Use: No  . Drug Use: No  . Sexual Activity: None   Other Topics Concern  . None   Social History Narrative   Single, Visual merchandiser   Lives with parents   Works at [employer]   Family History  Problem Relation Age of Onset  . Adopted: Yes  . Other      Pt was adopted, no Family Hx   Review of Systems As mentioned in the history of present illness all other review of systems are negative at this time. 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 [place] is related to his retinitis pigmentosa.    Objective:   Physical Exam General:  Well-developed, well-nourished and in no acute distress - obese Eyes:  anicteric. Lungs: Clear to auscultation bilaterally. Heart:  S1S2, no rubs, murmurs, gallops. Abdomen:  obese soft, non-tender, no hepatosplenomegaly, hernia, or mass and BS+.  Extremities:   no edema Skin   no rash. No stigmata of chronic liver disease Neuro:  A&O x 3.  Psych:  appropriate mood and  Affect.   Last LFTs with AST 43 ALT 1014 months ago and 7 months ago the AST was 37 ALT was 83. All the other liver chemistries were normal.   platelets are normal so was his white count. He has a low HDL but normal triglyceride and cholesterol. Assessment & Plan:  Fatty liver LFT, ceruloplasmin, ANA, HCV ab, Hep B S ag and Hep core Ab total, ferritin is to exclude other  inherited or acquired conditions that could have bearing on this. Weight loss if possible, he realizes that is the only true treatment for fatty liver. Bariatric options discussed and he will be given the phone number to the bariatric surgery clinic.  Morbid obesity If she could lose a large amount of weight that would help him in many ways. I think he actually might qualify for bariatric surgery given that he has sleep apnea and his morbid obesity and the fatty liver perhaps. I'm not sure, I don't know how interest he is in this, but he will be given the phone number to the surgery clinic to call if desired.   I appreciate the opportunity to care for this patient. Gatha Mayer, MD, [facility]  HF:[id number] Tamala Julian, PA-C

## 2014-04-22 LAB — HEPATITIS B CORE ANTIBODY, TOTAL: Hep B Core Total Ab: NONREACTIVE

## 2014-04-22 LAB — HEPATITIS C ANTIBODY: HCV Ab: NEGATIVE

## 2014-04-22 LAB — ANA: Anti Nuclear Antibody(ANA): NEGATIVE

## 2014-04-22 LAB — HEPATITIS B SURFACE ANTIGEN: Hepatitis B Surface Ag: NEGATIVE

## 2014-04-25 LAB — CERULOPLASMIN: CERULOPLASMIN: 22 mg/dL (ref 18–36)

## 2014-04-26 NOTE — Progress Notes (Signed)
Quick Note:  Liver chemistries are still abnormal as expected Other tests do not sghow problems with infection, inherited or autoimmune disease  Fatty liver is problem Weight loss is best treatment for him as we discussed Can f/u PCP and see GI prn ______

## 2014-09-10 IMAGING — US US ABDOMEN COMPLETE
1 series · 14 of 25 positions shown · non-contrast
Comparison: Ultrasound 04/03/2012.

CLINICAL DATA: Elevated LFTs.

EXAM:
ULTRASOUND ABDOMEN COMPLETE

[Series 1: us abdomen complete · 0.33mm/px · 14 of 69 slices shown]
[im 1/69]
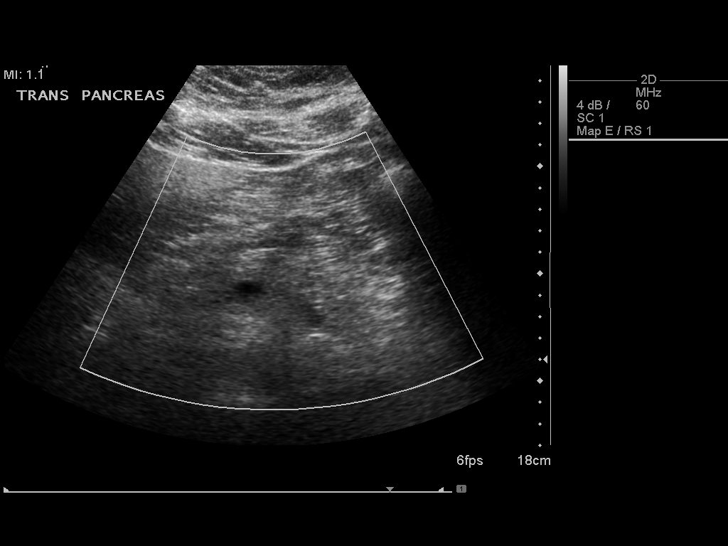
[im 6/69]
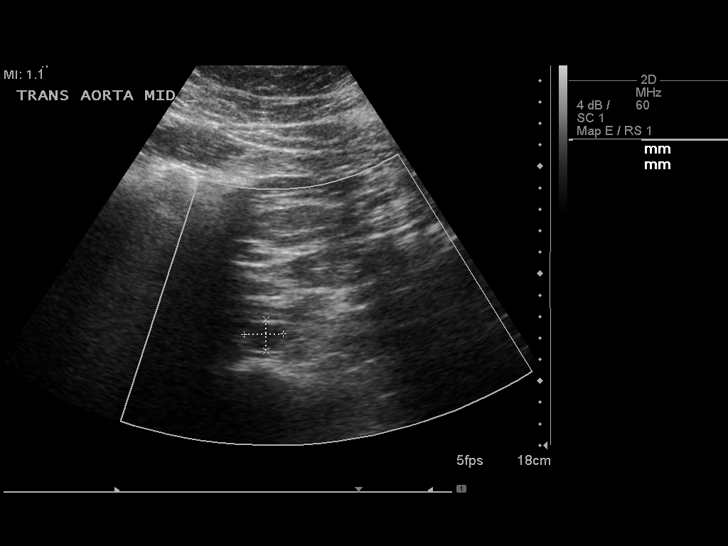
[im 12/69]
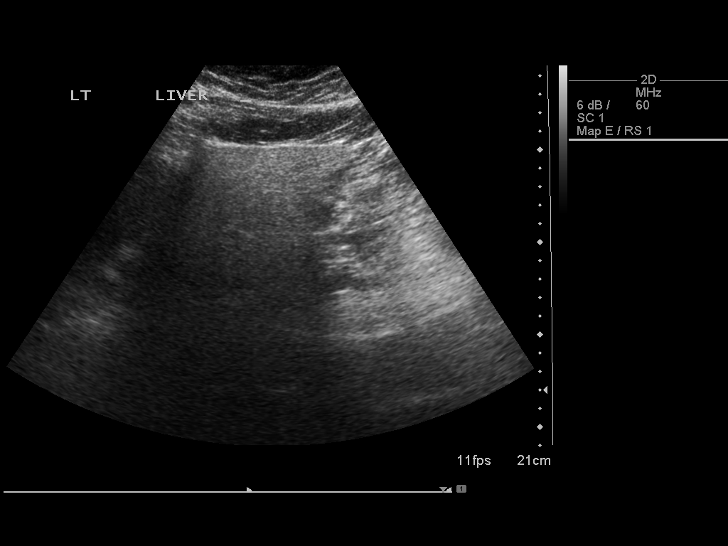
[im 18/69]
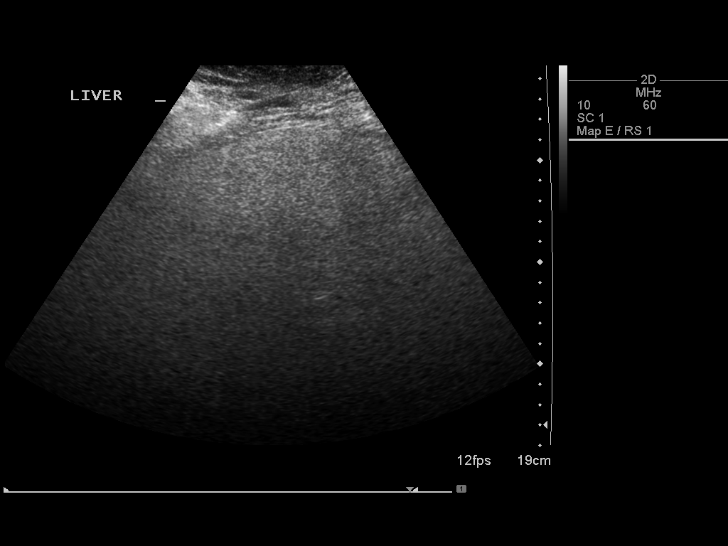
[im 23/69]
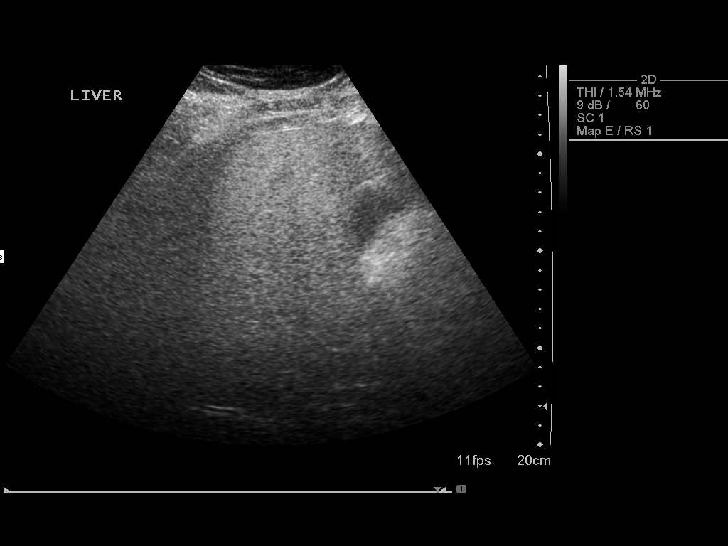
[im 26/69]
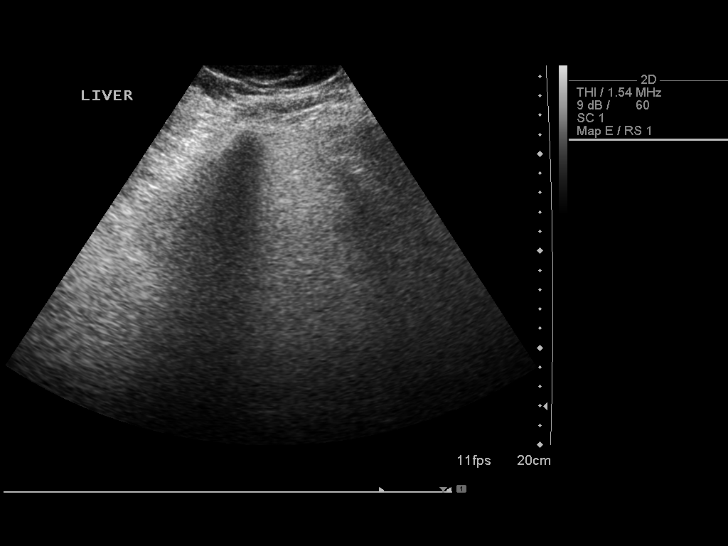
[im 32/69]
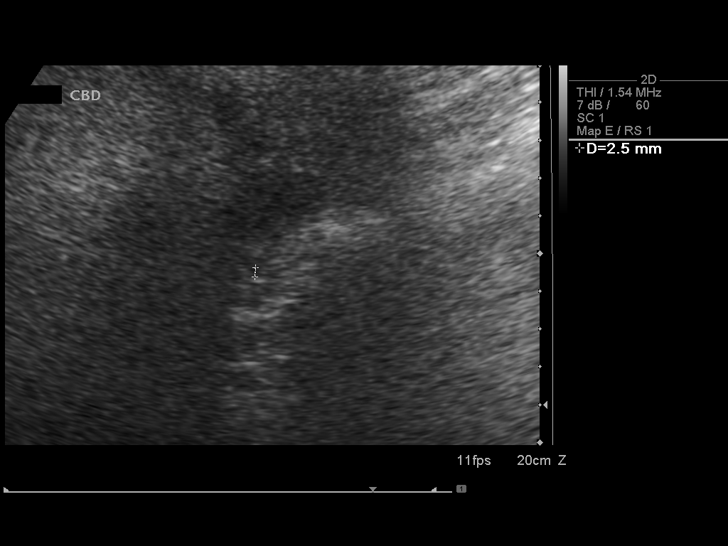
[im 37/69]
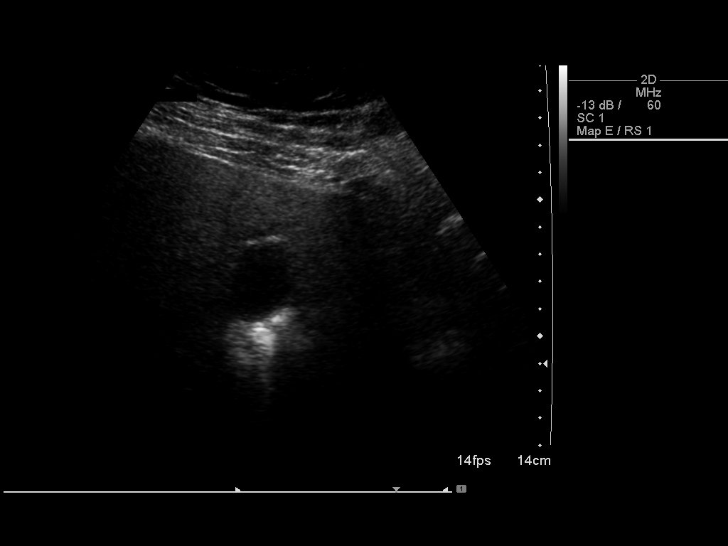
[im 43/69]
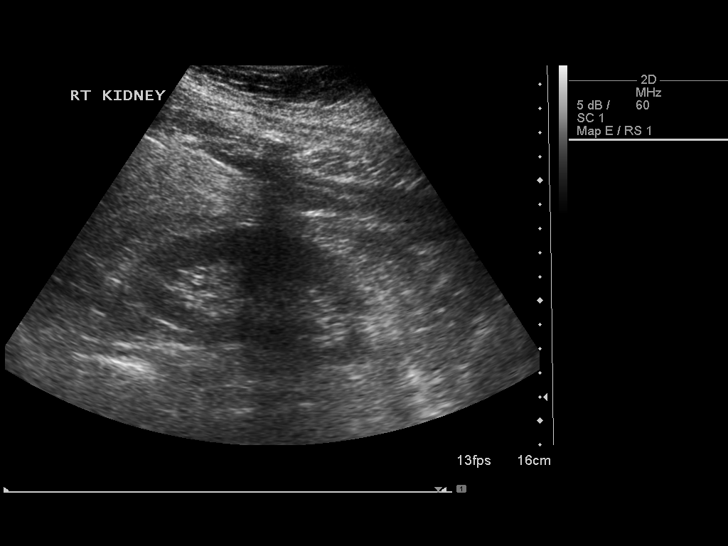
[im 46/69]
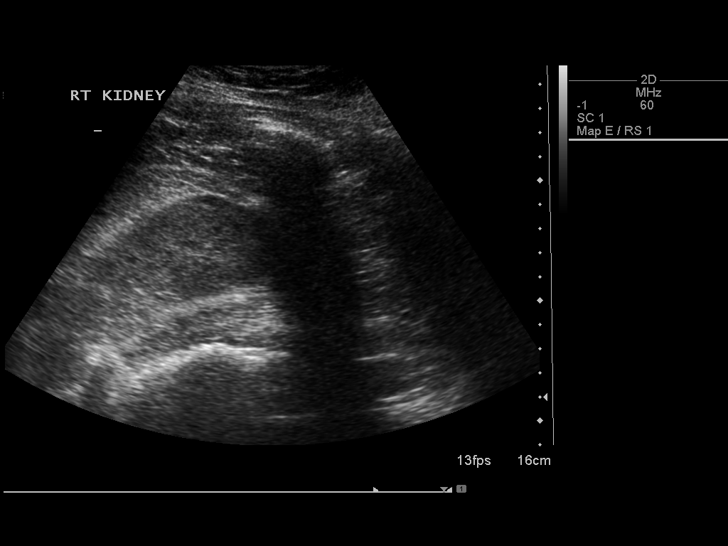
[im 52/69]
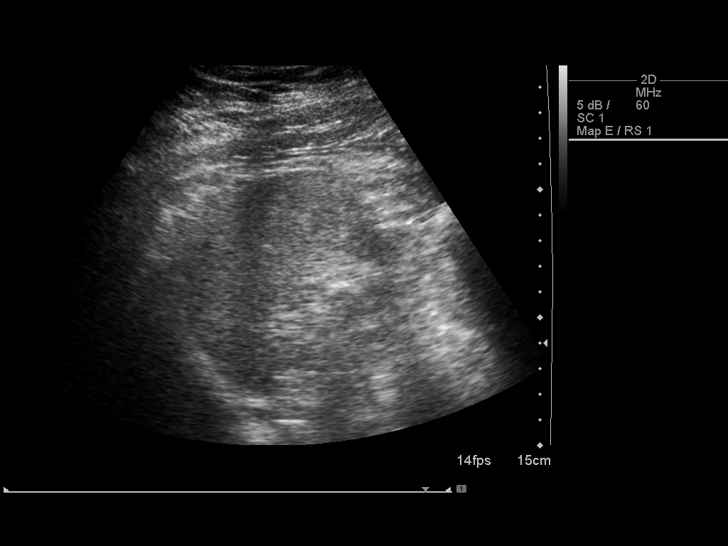
[im 57/69]
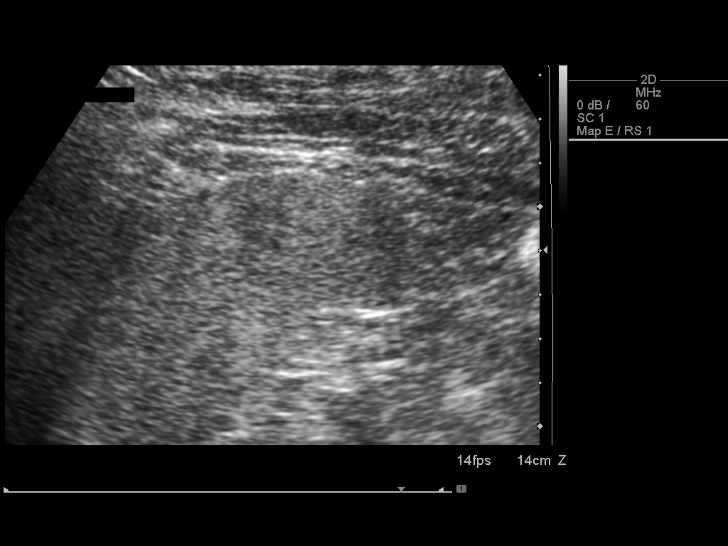
[im 63/69]
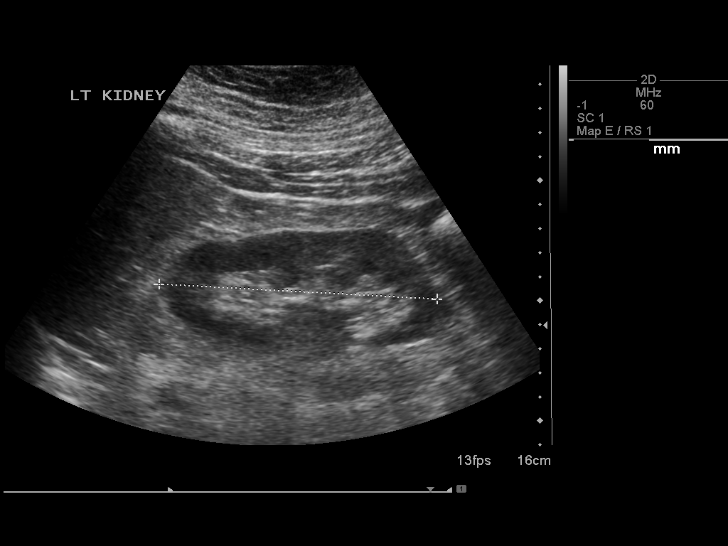
[im 69/69]
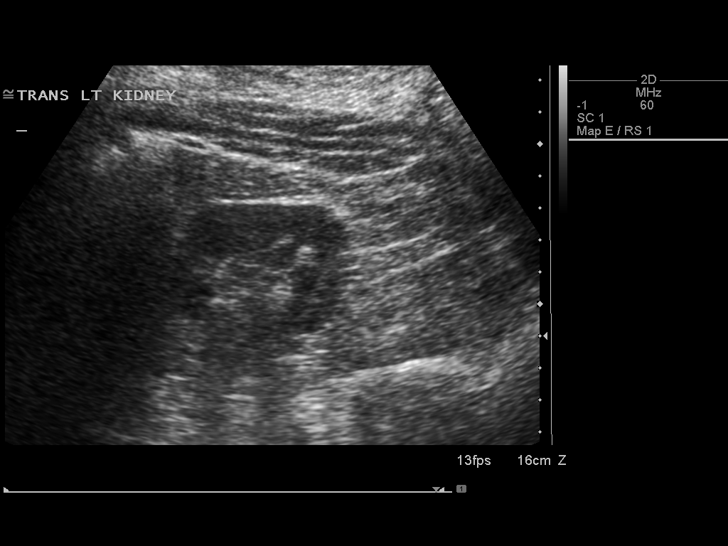

[14 of 25 positions shown; findings below may reference images not displayed]

FINDINGS: Gallbladder:

No gallstones or wall thickening visualized. No sonographic Murphy
sign noted.

Common bile duct:

Diameter: 2.5 mm.

Liver:

Echogenic liver suggesting fatty infiltration and/or hepatocellular
disease. Similar finding noted on prior exam.

IVC:

No abnormality visualized.

Pancreas:

Visualized portion unremarkable.

Spleen:

Stable 1.4 x 1.1 x 1.6 cm hypoechoic lesion in the spleen, most
likely benign vascular lesion.

Right Kidney:

Length: 10.7 cm. Echogenicity within normal limits. No mass or
hydronephrosis visualized.

Left Kidney:

Length: Left 0.6 cm.. Echogenicity within normal limits. No mass or
hydronephrosis visualized.

Abdominal aorta:

No aneurysm visualized.

Other findings:

None.
IMPRESSION: 1. Echogenic liver again noted consistent with fatty infiltration
and or hepatocellular disease.
2. Stable small hypoechoic lesions spleen, most likely benign
vascular lesion.

## 2014-09-20 ENCOUNTER — Encounter: Payer: Self-pay | Admitting: Emergency Medicine

## 2014-09-21 ENCOUNTER — Encounter: Payer: Self-pay | Admitting: Internal Medicine

## 2014-09-21 ENCOUNTER — Ambulatory Visit (INDEPENDENT_AMBULATORY_CARE_PROVIDER_SITE_OTHER): Payer: BLUE CROSS/BLUE SHIELD | Admitting: Internal Medicine

## 2014-09-21 VITALS — BP 124/82 | HR 80 | Temp 98.4°F | Resp 18 | Ht 67.25 in | Wt 287.0 lb

## 2014-09-21 DIAGNOSIS — L739 Follicular disorder, unspecified: Secondary | ICD-10-CM

## 2014-09-21 DIAGNOSIS — R030 Elevated blood-pressure reading, without diagnosis of hypertension: Secondary | ICD-10-CM

## 2014-09-21 DIAGNOSIS — R5383 Other fatigue: Secondary | ICD-10-CM

## 2014-09-21 DIAGNOSIS — Z0001 Encounter for general adult medical examination with abnormal findings: Secondary | ICD-10-CM | POA: Diagnosis not present

## 2014-09-21 DIAGNOSIS — R6889 Other general symptoms and signs: Secondary | ICD-10-CM | POA: Diagnosis not present

## 2014-09-21 DIAGNOSIS — D229 Melanocytic nevi, unspecified: Secondary | ICD-10-CM

## 2014-09-21 DIAGNOSIS — Z79899 Other long term (current) drug therapy: Secondary | ICD-10-CM

## 2014-09-21 DIAGNOSIS — R7303 Prediabetes: Secondary | ICD-10-CM

## 2014-09-21 DIAGNOSIS — F988 Other specified behavioral and emotional disorders with onset usually occurring in childhood and adolescence: Secondary | ICD-10-CM

## 2014-09-21 DIAGNOSIS — E559 Vitamin D deficiency, unspecified: Secondary | ICD-10-CM

## 2014-09-21 DIAGNOSIS — R7309 Other abnormal glucose: Secondary | ICD-10-CM

## 2014-09-21 DIAGNOSIS — IMO0001 Reserved for inherently not codable concepts without codable children: Secondary | ICD-10-CM

## 2014-09-21 LAB — LIPID PANEL
CHOL/HDL RATIO: 4.4 ratio
CHOLESTEROL: 148 mg/dL (ref 0–200)
HDL: 34 mg/dL — AB (ref >=40)
LDL CALC: 98 mg/dL (ref 0–99)
Triglycerides: 81 mg/dL (ref <150)
VLDL: 16 mg/dL (ref 0–40)

## 2014-09-21 LAB — BASIC METABOLIC PANEL WITH GFR
BUN: 17 mg/dL (ref 6–23)
CHLORIDE: 102 meq/L (ref 96–112)
CO2: 24 meq/L (ref 19–32)
CREATININE: 1.16 mg/dL (ref 0.50–1.35)
Calcium: 9.9 mg/dL (ref 8.4–10.5)
GFR, EST NON AFRICAN AMERICAN: 88 mL/min
Glucose, Bld: 87 mg/dL (ref 70–99)
Potassium: 4.2 mEq/L (ref 3.5–5.3)
Sodium: 139 mEq/L (ref 135–145)

## 2014-09-21 LAB — CBC WITH DIFFERENTIAL/PLATELET
BASOS ABS: 0.1 10*3/uL (ref 0.0–0.1)
Basophils Relative: 1 % (ref 0–1)
EOS PCT: 1 % (ref 0–5)
Eosinophils Absolute: 0.1 10*3/uL (ref 0.0–0.7)
HEMATOCRIT: 44.3 % (ref 39.0–52.0)
HEMOGLOBIN: 15.3 g/dL (ref 13.0–17.0)
LYMPHS PCT: 29 % (ref 12–46)
Lymphs Abs: 2.3 10*3/uL (ref 0.7–4.0)
MCH: 28 pg (ref 26.0–34.0)
MCHC: 34.5 g/dL (ref 30.0–36.0)
MCV: 81.1 fL (ref 78.0–100.0)
MONOS PCT: 7 % (ref 3–12)
MPV: 11.1 fL (ref 8.6–12.4)
Monocytes Absolute: 0.6 10*3/uL (ref 0.1–1.0)
NEUTROS ABS: 4.9 10*3/uL (ref 1.7–7.7)
Neutrophils Relative %: 62 % (ref 43–77)
PLATELETS: 244 10*3/uL (ref 150–400)
RBC: 5.46 MIL/uL (ref 4.22–5.81)
RDW: 13.9 % (ref 11.5–15.5)
WBC: 7.9 10*3/uL (ref 4.0–10.5)

## 2014-09-21 LAB — HEPATIC FUNCTION PANEL
ALBUMIN: 4.7 g/dL (ref 3.5–5.2)
ALT: 90 U/L — ABNORMAL HIGH (ref 0–53)
AST: 41 U/L — AB (ref 0–37)
Alkaline Phosphatase: 63 U/L (ref 39–117)
BILIRUBIN DIRECT: 0.2 mg/dL (ref 0.0–0.3)
BILIRUBIN TOTAL: 0.7 mg/dL (ref 0.2–1.2)
Indirect Bilirubin: 0.5 mg/dL (ref 0.2–1.2)
TOTAL PROTEIN: 7 g/dL (ref 6.0–8.3)

## 2014-09-21 LAB — VITAMIN B12: VITAMIN B 12: 308 pg/mL (ref 211–911)

## 2014-09-21 LAB — IRON AND TIBC
%SAT: 33 % (ref 20–55)
Iron: 126 ug/dL (ref 42–165)
TIBC: 380 ug/dL (ref 215–435)
UIBC: 254 ug/dL (ref 125–400)

## 2014-09-21 LAB — MAGNESIUM: Magnesium: 2.1 mg/dL (ref 1.5–2.5)

## 2014-09-21 LAB — TSH: TSH: 2.144 u[IU]/mL (ref 0.350–4.500)

## 2014-09-21 LAB — HEMOGLOBIN A1C
Hgb A1c MFr Bld: 5.7 % — ABNORMAL HIGH (ref <5.7)
Mean Plasma Glucose: 117 mg/dL — ABNORMAL HIGH (ref <117)

## 2014-09-21 MED ORDER — TRIAMCINOLONE ACETONIDE 0.1 % EX CREA: 1.0000 "application " | TOPICAL_CREAM | Freq: Four times a day (QID) | CUTANEOUS | Status: DC | PRN

## 2014-09-21 NOTE — Progress Notes (Addendum)
Patient ID: Jason Horne, male   DOB: Jul 16, 1990, 23 y.o.   MRN: [id number]  Complete Physical  Assessment and Plan: Encounter for general adult medical examination with abnormal findings -see below  1. Pre-diabetes -cont metformin -diet and exercise - Hemoglobin A1c - Insulin, random  2. Morbid obesity -diet and exercise - Lipid panel  3. ADD (attention deficit disorder) -seen by outside provider for this  4. Elevated BP  - Urinalysis, Routine w reflex microscopic (not at [facility]) - Microalbumin / creatinine urine ratio - TSH  5. Other fatigue  - Iron and TIBC - Vitamin B12 - Testosterone  6. Vitamin D deficiency -cont supplement - Vit D  25 hydroxy (rtn osteoporosis monitoring)  7. Medication management  - CBC with Differential/Platelet - BASIC METABOLIC PANEL WITH GFR - Hepatic function panel - Magnesium  8. Multiple nevi -per patients request.  No abnormal nevi noted on exam - Ambulatory referral to Dermatology  9. Folliculitis -kenalog -warm compresses -antibacterial soap -personal hygeine    Discussed med's effects and SE's. Screening labs and tests as requested with regular follow-up as recommended.  HPI Patient presents for a complete physical.   His blood pressure has been controlled at home, today their BP is BP: 124/82 mmHg He does not workout. He denies chest pain, shortness of breath, dizziness.  He is not currently exercises and he does not have as much time to workout.     He is not on cholesterol medication and denies myalgias. His cholesterol is at goal. The cholesterol last visit was:   Lab Results  Component Value Date   CHOL 140 12/13/2013   HDL 39* 12/13/2013   LDLCALC 75 12/13/2013   TRIG 130 12/13/2013   CHOLHDL 3.6 12/13/2013    He has not been working on diet and exercise for prediabetes, he is not on bASA, he is not on ACE/ARB and denies foot ulcerations, hyperglycemia, hypoglycemia , increased appetite, nausea,  paresthesia of the feet, polydipsia, polyuria, visual disturbances, vomiting and weight loss. Last A1C in the office was:  Lab Results  Component Value Date   HGBA1C 5.8* 12/13/2013    Patient is on Vitamin D supplement.   Lab Results  Component Value Date   VD25OH 33 09/15/2013    Patient reports that he has had some bumps on his legs which started yesterday.  He reports that they are not itchy.  He reports that he has not had it before in the past.  He reports that he has not been outside lately.  No new soaps detergents, or personal products.  He has not tried anything.    He reports that he is eating a lot of chicken and beef lately. He eats a lot of cream of chicken soup.     Current Medications:  Current Outpatient Prescriptions on File Prior to Visit  Medication Sig Dispense Refill  . metFORMIN (GLUCOPHAGE XR) 500 MG 24 hr tablet Take 1 tablet (500 mg total) by mouth daily with breakfast. 90 tablet 3  . methylphenidate (RITALIN) 10 MG tablet Take 1 tablet daily if needed for focusing & concentration 30 tablet 0   No current facility-administered medications on file prior to visit.    Health Maintenance:  Immunization History  Administered Date(s) Administered  . DTaP 06/04/1991, 08/24/1991, 10/14/1991, 08/24/1992, 10/29/1996  . HPV Quadrivalent 09/27/2008, 12/10/2008, 11/01/2009  . Hepatitis A 11/02/2004, 10/07/2005  . Hepatitis B 06/04/1991, 02/10/1992, 04/28/2003  . HiB (PRP-OMP) 06/04/1991, 08/24/1991, 10/14/1991, 08/24/1992  . IPV  06/04/1991, 08/24/1991, 08/24/1992, 10/29/1996  . Influenza Nasal 12/10/2008  . Influenza Whole 12/10/2008  . MMR 08/24/1992, 10/29/1996  . Meningococcal Conjugate 10/07/2005  . Meningococcal Polysaccharide 11/01/2009  . PPD Test 09/15/2013  . Tdap 04/18/2003, 12/13/2013    Patient Care Team: Unk Pinto, MD as PCP - General (Internal Medicine) Everitt Amber, MD as Consulting Physician (Ophthalmology) Jodi Marble, MD as  Consulting Physician (Otolaryngology)  Allergies: No Known Allergies  Medical History:  Past Medical History  Diagnosis Date  . Obese   . ADD (attention deficit disorder)   . Retinitis pigmentosa, both eyes   . Pre-diabetes   . OSA on CPAP 2014  . Retinitis pigmentosa 09/16/2013  . Fatty liver 09/16/2013  . Sleep apnea     Surgical History:  Past Surgical History  Procedure Laterality Date  . Tympanostomy    . Wrist fracture surgery Left     x 2 with bone graft  . Wisdom tooth extraction      Family History:  Family History  Problem Relation Age of Onset  . Adopted: Yes  . Other      Pt was adopted, no Family Hx    Social History:   History  Substance Use Topics  . Smoking status: Never Smoker   . Smokeless tobacco: Never Used  . Alcohol Use: No    Review of Systems:  Review of Systems  Constitutional: Negative for fever, chills, weight loss and malaise/fatigue.  HENT: Negative for congestion, ear pain and sore throat.   Eyes: Negative.   Respiratory: Negative for cough, shortness of breath and wheezing.   Cardiovascular: Negative for chest pain, palpitations and leg swelling.  Gastrointestinal: Positive for heartburn (1 time weekly). Negative for nausea, vomiting, diarrhea, constipation, blood in stool and melena.  Genitourinary: Negative for dysuria, urgency and frequency.  Skin: Positive for rash.  Neurological: Negative for dizziness, sensory change and headaches.  Psychiatric/Behavioral: Negative for depression. The patient is not nervous/anxious and does not have insomnia.     Physical Exam: Estimated body mass index is 44.62 kg/(m^2) as calculated from the following:   Height as of this encounter: 5' 7.25" (1.708 m).   Weight as of this encounter: 287 lb (130.182 kg). BP 124/82 mmHg  Pulse 80  Temp(Src) 98.4 F (36.9 C) (Temporal)  Resp 18  Ht 5' 7.25" (1.708 m)  Wt 287 lb (130.182 kg)  BMI 44.62 kg/m2  General Appearance: Well nourished, in no  apparent distress.  Eyes: PERRLA, EOMs, conjunctiva no swelling or erythema ENT/Mouth: Ear canals clear bilaterally with no erythema, swelling, discharge.  TMs normal bilaterally with no erythema, bulging, or retractions.  Oropharynx clear and moist with no exudate, swelling, or erythema.  Dentition normal.   Neck: Supple, thyroid normal. No bruits, JVD, cervical adenopathy Respiratory: Respiratory effort normal, BS equal bilaterally without rales, rhonchi, wheezing or stridor.  Cardio: RRR without murmurs, rubs or gallops. Brisk peripheral pulses without edema.  Chest: symmetric, with normal excursions Abdomen: Morbidly obese, Soft, nontender, no guarding, rebound, hernias, masses, or organomegaly. Musculoskeletal: Full ROM all peripheral extremities,5/5 strength, and normal gait.  Skin: Warm, dry without rashes, lesions, ecchymosis. Neuro: A&Ox3, Cranial nerves intact, reflexes equal bilaterally. Normal muscle tone, no cerebellar symptoms. Sensation intact.  Psych: Normal affect, Insight and Judgment appropriate.   Over 40 minutes of exam, counseling, chart review and critical decision making was performed  FORCUCCI, Loralai Eisman 10:13 AM [facility]

## 2014-09-21 NOTE — Patient Instructions (Signed)
Food Choices for Gastroesophageal Reflux Disease When you have gastroesophageal reflux disease (GERD), the foods you eat and your eating habits are very important. Choosing the right foods can help ease the discomfort of GERD. WHAT GENERAL GUIDELINES DO I NEED TO FOLLOW?  Choose fruits, vegetables, whole grains, low-fat dairy products, and low-fat meat, fish, and poultry.  Limit fats such as oils, salad dressings, butter, nuts, and avocado.  Keep a food diary to identify foods that cause symptoms.  Avoid foods that cause reflux. These may be different for different people.  Eat frequent small meals instead of three large meals each day.  Eat your meals slowly, in a relaxed setting.  Limit fried foods.  Cook foods using methods other than frying.  Avoid drinking alcohol.  Avoid drinking large amounts of liquids with your meals.  Avoid bending over or lying down until 2-3 hours after eating. WHAT FOODS ARE NOT RECOMMENDED? The following are some foods and drinks that may worsen your symptoms: Vegetables Tomatoes. Tomato juice. Tomato and spaghetti sauce. Chili peppers. Onion and garlic. Horseradish. Fruits Oranges, grapefruit, and lemon (fruit and juice). Meats High-fat meats, fish, and poultry. This includes hot dogs, ribs, ham, sausage, salami, and bacon. Dairy Whole milk and chocolate milk. Sour cream. Cream. Butter. Ice cream. Cream cheese.  Beverages Coffee and tea, with or without caffeine. Carbonated beverages or energy drinks. Condiments Hot sauce. Barbecue sauce.  Sweets/Desserts Chocolate and cocoa. Donuts. Peppermint and spearmint. Fats and Oils High-fat foods, including [place] fries and potato chips. Other Vinegar. Strong spices, such as black pepper, white pepper, red pepper, cayenne, curry powder, cloves, ginger, and chili powder. The items listed above may not be a complete list of foods and beverages to avoid. Contact your dietitian for more  information. Document Released: 04/01/2005 Document Revised: 04/06/2013 Document Reviewed: 02/03/2013 [facility] Patient Information 2015 [place], [place]. This information is not intended to replace advice given to you by your health care provider. Make sure you discuss any questions you have with your health care provider.    Preventive Care for Adults  A healthy lifestyle and preventive care can promote health and wellness. Preventive health guidelines for men include the following key practices:  A routine yearly physical is a good way to check with your health care provider about your health and preventative screening. It is a chance to share any concerns and updates on your health and to receive a thorough exam.  Visit your dentist for a routine exam and preventative care every 6 months. Brush your teeth twice a day and floss once a day. Good oral hygiene prevents tooth decay and gum disease.  The frequency of eye exams is based on your age, health, family medical history, use of contact lenses, and other factors. Follow your health care provider's recommendations for frequency of eye exams.  Eat a healthy diet. Foods such as vegetables, fruits, whole grains, low-fat dairy products, and lean protein foods contain the nutrients you need without too many calories. Decrease your intake of foods high in solid fats, added sugars, and salt. Eat the right amount of calories for you.Get information about a proper diet from your health care provider, if necessary.  Regular physical exercise is one of the most important things you can do for your health. Most adults should get at least 150 minutes of moderate-intensity exercise (any activity that increases your heart rate and causes you to sweat) each week. In addition, most adults need muscle-strengthening exercises on 2 or more days  a week.  Maintain a healthy weight. The body mass index (BMI) is a screening tool to identify possible weight problems.  It provides an estimate of body fat based on height and weight. Your health care provider can find your BMI and can help you achieve or maintain a healthy weight.For adults 20 years and older:  A BMI below 18.5 is considered underweight.  A BMI of 18.5 to 24.9 is normal.  A BMI of 25 to 29.9 is considered overweight.  A BMI of 30 and above is considered obese.  Maintain normal blood lipids and cholesterol levels by exercising and minimizing your intake of saturated fat. Eat a balanced diet with plenty of fruit and vegetables. Blood tests for lipids and cholesterol should begin at age 17 and be repeated every 5 years. If your lipid or cholesterol levels are high, you are over 50, or you are at high risk for heart disease, you may need your cholesterol levels checked more frequently.Ongoing high lipid and cholesterol levels should be treated with medicines if diet and exercise are not working.  If you smoke, find out from your health care provider how to quit. If you do not use tobacco, do not start.  Lung cancer screening is recommended for adults aged 81-80 years who are at high risk for developing lung cancer because of a history of smoking. A yearly low-dose CT scan of the lungs is recommended for people who have at least a 30-pack-year history of smoking and are a current smoker or have quit within the past 15 years. A pack year of smoking is smoking an average of 1 pack of cigarettes a day for 1 year (for example: 1 pack a day for 30 years or 2 packs a day for 15 years). Yearly screening should continue until the smoker has stopped smoking for at least 15 years. Yearly screening should be stopped for people who develop a health problem that would prevent them from having lung cancer treatment.  If you choose to drink alcohol, do not have more than 2 drinks per day. One drink is considered to be 12 ounces (355 mL) of beer, 5 ounces (148 mL) of wine, or 1.5 ounces (44 mL) of liquor.  High blood  pressure causes heart disease and increases the risk of stroke. Your blood pressure should be checked. Ongoing high blood pressure should be treated with medicines, if weight loss and exercise are not effective.  If you are 33-62 years old, ask your health care provider if you should take aspirin to prevent heart disease.  Diabetes screening involves taking a blood sample to check your fasting blood sugar level. Testing should be considered at a younger age or be carried out more frequently if you are overweight and have at least 1 risk factor for diabetes.  Colorectal cancer can be detected and often prevented. Most routine colorectal cancer screening begins at the age of 75 and continues through age 81. However, your health care provider may recommend screening at an earlier age if you have risk factors for colon cancer. On a yearly basis, your health care provider may provide home test kits to check for hidden blood in the stool. Use of a small camera at the end of a tube to directly examine the colon (sigmoidoscopy or colonoscopy) can detect the earliest forms of colorectal cancer. Talk to your health care provider about this at age 75, when routine screening begins. Direct exam of the colon should be repeated every 5-10 years  through age 79, unless early forms of precancerous polyps or small growths are found.  Screening for abdominal aortic aneurysm (AAA)  are recommended for persons over age 67 who have history of hypertensionor who are current or former smokers.  Talk with your health care provider about prostate cancer screening.  Testicular cancer screening is recommended for adult males. Screening includes self-exam, a health care provider exam, and other screening tests. Consult with your health care provider about any symptoms you have or any concerns you have about testicular cancer.  Use sunscreen. Apply sunscreen liberally and repeatedly throughout the day. You should seek shade when  your shadow is shorter than you. Protect yourself by wearing long sleeves, pants, a wide-brimmed hat, and sunglasses year round, whenever you are outdoors.  Once a month, do a whole-body skin exam, using a mirror to look at the skin on your back. Tell your health care provider about new moles, moles that have irregular borders, moles that are larger than a pencil eraser, or moles that have changed in shape or color.  Stay current with required vaccines (immunizations).  Influenza vaccine. All adults should be immunized every year.  Tetanus, diphtheria, and acellular pertussis (Td, Tdap) vaccine. An adult who has not previously received Tdap or who does not know his vaccine status should receive 1 dose of Tdap. This initial dose should be followed by tetanus and diphtheria toxoids (Td) booster doses every 10 years. Adults with an unknown or incomplete history of completing a 3-dose immunization series with Td-containing vaccines should begin or complete a primary immunization series including a Tdap dose. Adults should receive a Td booster every 10 years.  Zoster vaccine. One dose is recommended for adults aged 42 years or older unless certain conditions are present.    Pneumococcal 13-valent conjugate (PCV13) vaccine. When indicated, a person who is uncertain of his immunization history and has no record of immunization should receive the PCV13 vaccine. An adult aged 9 years or older who has certain medical conditions and has not been previously immunized should receive 1 dose of PCV13 vaccine. This PCV13 should be followed with a dose of pneumococcal polysaccharide (PPSV23) vaccine. The PPSV23 vaccine dose should be obtained at least 8 weeks after the dose of PCV13 vaccine. An adult aged 40 years or older who has certain medical conditions and previously received 1 or more doses of PPSV23 vaccine should receive 1 dose of PCV13. The PCV13 vaccine dose should be obtained 1 or more years after the last  PPSV23 vaccine dose.    Pneumococcal polysaccharide (PPSV23) vaccine. When PCV13 is also indicated, PCV13 should be obtained first. All adults aged 70 years and older should be immunized. An adult younger than age 32 years who has certain medical conditions should be immunized. Any person who resides in a nursing home or long-term care facility should be immunized. An adult smoker should be immunized. People with an immunocompromised condition and certain other conditions should receive both PCV13 and PPSV23 vaccines. People with human immunodeficiency virus (HIV) infection should be immunized as soon as possible after diagnosis. Immunization during chemotherapy or radiation therapy should be avoided. Routine use of PPSV23 vaccine is not recommended for American Indians, [place] Natives, or people younger than 65 years unless there are medical conditions that require PPSV23 vaccine. When indicated, people who have unknown immunization and have no record of immunization should receive PPSV23 vaccine. One-time revaccination 5 years after the first dose of PPSV23 is recommended for people aged 19-64 years  who have chronic kidney failure, nephrotic syndrome, asplenia, or immunocompromised conditions. People who received 1-2 doses of PPSV23 before age 17 years should receive another dose of PPSV23 vaccine at age 62 years or later if at least 5 years have passed since the previous dose. Doses of PPSV23 are not needed for people immunized with PPSV23 at or after age 14 years.  Hepatitis A vaccine. Adults who wish to be protected from this disease, have certain high-risk conditions, work with hepatitis A-infected animals, work in hepatitis A research labs, or travel to or work in countries with a high rate of hepatitis A should be immunized. Adults who were previously unvaccinated and who anticipate close contact with an international adoptee during the first 60 days after arrival in the [place] from a country  with a high rate of hepatitis A should be immunized.  Hepatitis B vaccine. Adults should be immunized if they wish to be protected from this disease, have certain high-risk conditions, may be exposed to blood or other infectious body fluids, are household contacts or sex partners of hepatitis B positive people, are clients or workers in certain care facilities, or travel to or work in countries with a high rate of hepatitis B.  Preventive Service / Frequency  Ages 84 to 2  Blood pressure check.  Lipid and cholesterol check.  Hepatitis C blood test.** / For any individual with known risks for hepatitis C.  Skin self-exam. / Monthly.  Influenza vaccine. / Every year.  Tetanus, diphtheria, and acellular pertussis (Tdap, Td) vaccine.** / Consult your health care provider. 1 dose of Td every 10 years.  HPV vaccine. / 3 doses over 6 months, if 70 or younger.  Measles, mumps, rubella (MMR) vaccine.** / You need at least 1 dose of MMR if you were born in 1957 or later. You may also need a second dose.  Pneumococcal 13-valent conjugate (PCV13) vaccine.** / Consult your health care provider.  Pneumococcal polysaccharide (PPSV23) vaccine.** / 1 to 2 doses if you smoke cigarettes or if you have certain conditions.  Meningococcal vaccine.** / 1 dose if you are age 62 to 71 years and a Market researcher living in a residence hall, or have one of several medical conditions. You may also need additional booster doses.  Hepatitis A vaccine.** / Consult your health care provider.  Hepatitis B vaccine.** / Consult your health care provider.

## 2014-09-22 LAB — URINALYSIS, ROUTINE W REFLEX MICROSCOPIC
Glucose, UA: NEGATIVE mg/dL
Hgb urine dipstick: NEGATIVE
Leukocytes, UA: NEGATIVE
Nitrite: NEGATIVE
PROTEIN: NEGATIVE mg/dL
Specific Gravity, Urine: 1.026 (ref 1.005–1.030)
UROBILINOGEN UA: 0.2 mg/dL (ref 0.0–1.0)
pH: 5.5 (ref 5.0–8.0)

## 2014-09-22 LAB — MICROALBUMIN / CREATININE URINE RATIO
CREATININE, URINE: 556.7 mg/dL
Microalb Creat Ratio: 4.1 mg/g (ref 0.0–30.0)
Microalb, Ur: 2.3 mg/dL — ABNORMAL HIGH (ref <2.0)

## 2014-09-22 LAB — TESTOSTERONE: TESTOSTERONE: 241 ng/dL — AB (ref 300–890)

## 2014-09-22 LAB — VITAMIN D 25 HYDROXY (VIT D DEFICIENCY, FRACTURES): Vit D, 25-Hydroxy: 24 ng/mL — ABNORMAL LOW (ref 30–100)

## 2014-09-22 LAB — INSULIN, RANDOM: Insulin: 18.7 u[IU]/mL (ref 2.0–19.6)

## 2014-12-01 ENCOUNTER — Other Ambulatory Visit: Payer: Self-pay | Admitting: Internal Medicine

## 2014-12-01 NOTE — Addendum Note (Signed)
Addended by: Starlyn Skeans A on: 12/01/2014 09:24 AM   Modules accepted: Level of Service, SmartSet

## 2014-12-26 ENCOUNTER — Ambulatory Visit: Payer: Self-pay | Admitting: Internal Medicine

## 2015-01-05 ENCOUNTER — Ambulatory Visit (INDEPENDENT_AMBULATORY_CARE_PROVIDER_SITE_OTHER): Payer: BLUE CROSS/BLUE SHIELD | Admitting: Internal Medicine

## 2015-01-05 ENCOUNTER — Encounter: Payer: Self-pay | Admitting: Internal Medicine

## 2015-01-05 VITALS — BP 126/84 | HR 88 | Temp 98.2°F | Resp 18 | Ht 67.25 in | Wt 284.0 lb

## 2015-01-05 DIAGNOSIS — F909 Attention-deficit hyperactivity disorder, unspecified type: Secondary | ICD-10-CM

## 2015-01-05 DIAGNOSIS — E559 Vitamin D deficiency, unspecified: Secondary | ICD-10-CM | POA: Diagnosis not present

## 2015-01-05 DIAGNOSIS — Z23 Encounter for immunization: Secondary | ICD-10-CM | POA: Diagnosis not present

## 2015-01-05 DIAGNOSIS — R7309 Other abnormal glucose: Secondary | ICD-10-CM | POA: Diagnosis not present

## 2015-01-05 DIAGNOSIS — Z79899 Other long term (current) drug therapy: Secondary | ICD-10-CM | POA: Diagnosis not present

## 2015-01-05 DIAGNOSIS — F988 Other specified behavioral and emotional disorders with onset usually occurring in childhood and adolescence: Secondary | ICD-10-CM

## 2015-01-05 DIAGNOSIS — K760 Fatty (change of) liver, not elsewhere classified: Secondary | ICD-10-CM | POA: Diagnosis not present

## 2015-01-05 DIAGNOSIS — R7303 Prediabetes: Secondary | ICD-10-CM

## 2015-01-05 LAB — CBC WITH DIFFERENTIAL/PLATELET
Basophils Absolute: 0 10*3/uL (ref 0.0–0.1)
Basophils Relative: 0 % (ref 0–1)
EOS ABS: 0.1 10*3/uL (ref 0.0–0.7)
Eosinophils Relative: 1 % (ref 0–5)
HCT: 46.1 % (ref 39.0–52.0)
HEMOGLOBIN: 15.8 g/dL (ref 13.0–17.0)
Lymphocytes Relative: 25 % (ref 12–46)
Lymphs Abs: 1.8 10*3/uL (ref 0.7–4.0)
MCH: 28.1 pg (ref 26.0–34.0)
MCHC: 34.3 g/dL (ref 30.0–36.0)
MCV: 82 fL (ref 78.0–100.0)
MONOS PCT: 6 % (ref 3–12)
MPV: 10.9 fL (ref 8.6–12.4)
Monocytes Absolute: 0.4 10*3/uL (ref 0.1–1.0)
NEUTROS ABS: 4.8 10*3/uL (ref 1.7–7.7)
Neutrophils Relative %: 68 % (ref 43–77)
Platelets: 248 10*3/uL (ref 150–400)
RBC: 5.62 MIL/uL (ref 4.22–5.81)
RDW: 13.6 % (ref 11.5–15.5)
WBC: 7 10*3/uL (ref 4.0–10.5)

## 2015-01-05 LAB — BASIC METABOLIC PANEL WITH GFR
BUN: 16 mg/dL (ref 7–25)
CO2: 29 mmol/L (ref 20–31)
CREATININE: 1.15 mg/dL (ref 0.60–1.35)
Calcium: 9.8 mg/dL (ref 8.6–10.3)
Chloride: 103 mmol/L (ref 98–110)
GFR, Est Non African American: 89 mL/min (ref >=60)
GLUCOSE: 90 mg/dL (ref 65–99)
Potassium: 4.2 mmol/L (ref 3.5–5.3)
SODIUM: 142 mmol/L (ref 135–146)

## 2015-01-05 LAB — HEPATIC FUNCTION PANEL
ALT: 79 U/L — AB (ref 9–46)
AST: 38 U/L (ref 10–40)
Albumin: 4.7 g/dL (ref 3.6–5.1)
Alkaline Phosphatase: 64 U/L (ref 40–115)
Bilirubin, Direct: 0.2 mg/dL (ref <=0.2)
Indirect Bilirubin: 0.6 mg/dL (ref 0.2–1.2)
TOTAL PROTEIN: 7 g/dL (ref 6.1–8.1)
Total Bilirubin: 0.8 mg/dL (ref 0.2–1.2)

## 2015-01-05 LAB — LIPID PANEL
CHOLESTEROL: 137 mg/dL (ref 125–200)
HDL: 34 mg/dL — ABNORMAL LOW (ref >=40)
LDL Cholesterol: 89 mg/dL (ref <130)
TRIGLYCERIDES: 70 mg/dL (ref <150)
Total CHOL/HDL Ratio: 4 Ratio (ref <=5.0)
VLDL: 14 mg/dL (ref <30)

## 2015-01-05 LAB — MAGNESIUM: Magnesium: 2.1 mg/dL (ref 1.5–2.5)

## 2015-01-05 LAB — TSH: TSH: 1.511 u[IU]/mL (ref 0.350–4.500)

## 2015-01-05 NOTE — Progress Notes (Signed)
Patient ID: Jason Horne, male   DOB: [birth year], 24 y.o.   MRN: [id number]  Assessment and Plan:  Hypertension:  -Continue medication,  -monitor blood pressure at home.  -Continue DASH diet.   -Reminder to go to the ER if any CP, SOB, nausea, dizziness, severe HA, changes vision/speech, left arm numbness and tingling, and jaw pain.  Cholesterol: -Continue diet and exercise.  -Check cholesterol.   Pre-diabetes: -Continue diet and exercise.  -Check A1C  Vitamin D Def: -check level -continue medications.   Continue diet and meds as discussed. Further disposition pending results of labs.  HPI 24 y.o. male  presents for 3 month follow up with hypertension, hyperlipidemia, prediabetes and vitamin D.   His blood pressure has been controlled at home, today their BP is BP: 126/84 mmHg.   He does workout. He denies chest pain, shortness of breath, dizziness.  He has been walking approximately 40 minutes on Monday and Wednesday.  He is thinking about adding in a couple more days.   He is on cholesterol medication and denies myalgias. His cholesterol is at goal. The cholesterol last visit was:   Lab Results  Component Value Date   CHOL 148 09/21/2014   HDL 34* 09/21/2014   LDLCALC 98 09/21/2014   TRIG 81 09/21/2014   CHOLHDL 4.4 09/21/2014     He has been working on diet and exercise for prediabetes, and denies foot ulcerations, hyperglycemia, hypoglycemia , increased appetite, nausea, paresthesia of the feet, polydipsia, polyuria, visual disturbances, vomiting and weight loss. Last A1C in the office was:  Lab Results  Component Value Date   HGBA1C 5.7* 09/21/2014    Patient is on Vitamin D supplement.  Lab Results  Component Value Date   VD25OH 24* 09/21/2014      Current Medications:  Current Outpatient Prescriptions on File Prior to Visit  Medication Sig Dispense Refill  . lisdexamfetamine (VYVANSE) 40 MG capsule Take 40 mg by mouth every morning.    . metFORMIN  (GLUCOPHAGE XR) 500 MG 24 hr tablet Take 1 tablet (500 mg total) by mouth daily with breakfast. 90 tablet 3  . methylphenidate (RITALIN) 10 MG tablet Take 1 tablet daily if needed for focusing & concentration 30 tablet 0  . triamcinolone cream (KENALOG) 0.1 % Apply 1 application topically 4 (four) times daily as needed. 30 g 0   No current facility-administered medications on file prior to visit.    Medical History:  Past Medical History  Diagnosis Date  . Obese   . ADD (attention deficit disorder)   . Retinitis pigmentosa, both eyes   . Pre-diabetes   . OSA on CPAP 2014  . Retinitis pigmentosa 09/16/2013  . Fatty liver 09/16/2013  . Sleep apnea     Allergies: No Known Allergies   Review of Systems:  Review of Systems  Constitutional: Negative for fever, chills and malaise/fatigue.  HENT: Negative for congestion, ear pain and sore throat.   Respiratory: Negative for cough, shortness of breath and wheezing.   Cardiovascular: Negative for chest pain, palpitations and leg swelling.  Gastrointestinal: Negative for heartburn, diarrhea, constipation, blood in stool and melena.  Genitourinary: Negative.   Skin: Negative.   Neurological: Negative for dizziness, sensory change, loss of consciousness and headaches.  Psychiatric/Behavioral: Negative for depression. The patient is not nervous/anxious and does not have insomnia.     Family history- Review and unchanged  Social history- Review and unchanged  Physical Exam: BP 126/84 mmHg  Pulse 88  Temp(Src) 98.2  F (36.8 C) (Temporal)  Resp 18  Ht 5' 7.25" (1.708 m)  Wt 284 lb (128.822 kg)  BMI 44.16 kg/m2 Wt Readings from Last 3 Encounters:  01/05/15 284 lb (128.822 kg)  09/21/14 287 lb (130.182 kg)  04/21/14 292 lb 2 oz (132.507 kg)    General Appearance: Well nourished well developed, in no apparent distress. Eyes: PERRLA, EOMs, conjunctiva no swelling or erythema ENT/Mouth: Ear canals normal without obstruction, swelling,  erythma, discharge.  TMs normal bilaterally.  Oropharynx moist, clear, without exudate, or postoropharyngeal swelling. Neck: Supple, thyroid normal,no cervical adenopathy  Respiratory: Respiratory effort normal, Breath sounds clear A&P without rhonchi, wheeze, or rale.  No retractions, no accessory usage. Cardio: RRR with no MRGs. Brisk peripheral pulses without edema.  Abdomen: Soft, + BS,  Non tender, no guarding, rebound, hernias, masses. Musculoskeletal: Full ROM, 5/5 strength, Normal gait Skin: Warm, dry without rashes, lesions, ecchymosis.  Neuro: Awake and oriented X 3, Cranial nerves intact. Normal muscle tone, no cerebellar symptoms. Psych: Normal affect, Insight and Judgment appropriate.    Starlyn Skeans, PA-C 11:11 AM [facility]

## 2015-01-05 NOTE — Patient Instructions (Signed)
Before you even begin to attack a weight-loss plan, it pays to remember this: You are not fat. You have fat. Losing weight isn't about blame or shame; it's simply another achievement to accomplish. Dieting is like any other skill-you have to buckle down and work at it. As long as you act in a smart, reasonable way, you'll ultimately get where you want to be. Here are some weight loss pearls for you.  1. It's Not a Diet. It's a Lifestyle Thinking of a diet as something you're on and suffering through only for the short term doesn't work. To shed weight and keep it off, you need to make permanent changes to the way you eat. It's OK to indulge occasionally, of course, but if you cut calories temporarily and then revert to your old way of eating, you'll gain back the weight quicker than you can say yo-yo. Use it to lose it. Research shows that one of the best predictors of long-term weight loss is how many pounds you drop in the first month. For that reason, nutritionists often suggest being stricter for the first two weeks of your new eating strategy to build momentum. Cut out added sugar and alcohol and avoid unrefined carbs. After that, figure out how you can reincorporate them in a way that's healthy and maintainable.  2. There's a Right Way to Exercise Working out burns calories and fat and boosts your metabolism by building muscle. But those trying to lose weight are notorious for overestimating the number of calories they burn and underestimating the amount they take in. Unfortunately, your system is biologically programmed to hold on to extra pounds and that means when you start exercising, your body senses the deficit and ramps up its hunger signals. If you're not diligent, you'll eat everything you burn and then some. Use it to lose it. Cardio gets all the exercise glory, but strength and interval training are the real heroes. They help you build lean muscle, which in turn increases your metabolism and  calorie-burning ability 3. Don't Overreact to Mild Hunger Some people have a hard time losing weight because of hunger anxiety. To them, being hungry is bad-something to be avoided at all costs-so they carry snacks with them and eat when they don't need to. Others eat because they're stressed out or bored. While you never want to get to the point of being ravenous (that's when bingeing is likely to happen), a hunger pang, a craving, or the fact that it's 3:00 p.m. should not send you racing for the vending machine or obsessing about the energy bar in your purse. Ideally, you should put off eating until your stomach is growling and it's difficult to concentrate.  Use it to lose it. When you feel the urge to eat, use the HALT method. Ask yourself, Am I really hungry? Or am I angry or anxious, lonely or bored, or tired? If you're still not certain, try the apple test. If you're truly hungry, an apple should seem delicious; if it doesn't, something else is going on. Or you can try drinking water and making yourself busy, if you are still hungry try a healthy snack.  4. Not All Calories Are Created Equal The mechanics of weight loss are pretty simple: Take in fewer calories than you use for energy. But the kind of food you eat makes all the difference. Processed food that's high in saturated fat and refined starch or sugar can cause inflammation that disrupts the hormone signals that tell   your brain you're full. The result: You eat a lot more.  Use it to lose it. Clean up your diet. Swap in whole, unprocessed foods, including vegetables, lean protein, and healthy fats that will fill you up and give you the biggest nutritional bang for your calorie buck. In a few weeks, as your brain starts receiving regular hunger and fullness signals once again, you'll notice that you feel less hungry overall and naturally start cutting back on the amount you eat.  5. Protein, Produce, and Plant-Based Fats Are Your Weight-Loss  [place] Here's why eating the three Ps regularly will help you drop pounds. Protein fills you up. You need it to build lean muscle, which keeps your metabolism humming so that you can torch more fat. People in a weight-loss program who ate double the recommended daily allowance for protein (about 110 grams for a 150-pound woman) lost 70 percent of their weight from fat, while people who ate the RDA lost only about 40 percent, one study found. Produce is packed with filling fiber. "It's very difficult to consume too many calories if you're eating a lot of vegetables. Example: Three cups of broccoli is a lot of food, yet only 93 calories. (Fruit is another story. It can be easy to overeat and can contain a lot of calories from sugar, so be sure to monitor your intake.) Plant-based fats like olive oil and those in avocados and nuts are healthy and extra satiating.  Use it to lose it. Aim to incorporate each of the three Ps into every meal and snack. People who eat protein throughout the day are able to keep weight off, according to a study in the American Journal of Clinical Nutrition. In addition to meat, poultry and seafood, good sources are beans, lentils, eggs, tofu, and yogurt. As for fat, keep portion sizes in check by measuring out salad dressing, oil, and nut butters (shoot for one to two tablespoons). Finally, eat veggies or a little fruit at every meal. People who did that consumed 308 fewer calories but didn't feel any hungrier than when they didn't eat more produce.  7. How You Eat Is As Important As What You Eat In order for your brain to register that you're full, you need to focus on what you're eating. Sit down whenever you eat, preferably at a table. Turn off the TV or computer, put down your phone, and look at your food. Smell it. Chew slowly, and don't put another bite on your fork until you swallow. When women ate lunch this attentively, they consumed 30 percent less when snacking later than  those who listened to an audiobook at lunchtime, according to a study in the British Journal of Nutrition. 8. Weighing Yourself Really Works The scale provides the best evidence about whether your efforts are paying off. Seeing the numbers tick up or down or stagnate is motivation to keep going-or to rethink your approach. A 2015 study at [employer] found that daily weigh-ins helped people lose more weight, keep it off, and maintain that loss, even after two years. Use it to lose it. Step on the scale at the same time every day for the best results. If your weight shoots up several pounds from one weigh-in to the next, don't freak out. Eating a lot of salt the night before or having your period is the likely culprit. The number should return to normal in a day or two. It's a steady climb that you need to do something about.   9. Too Much Stress and Too Little Sleep Are Your Enemies When you're tired and frazzled, your body cranks up the production of cortisol, the stress hormone that can cause carb cravings. Not getting enough sleep also boosts your levels of ghrelin, a hormone associated with hunger, while suppressing leptin, a hormone that signals fullness and satiety. People on a diet who slept only five and a half hours a night for two weeks lost 55 percent less fat and were hungrier than those who slept eight and a half hours, according to a study in the [employer] Medical Association Journal. Use it to lose it. Prioritize sleep, aiming for seven hours or more a night, which research shows helps lower stress. And make sure you're getting quality zzz's. If a snoring spouse or a fidgety cat wakes you up frequently throughout the night, you may end up getting the equivalent of just four hours of sleep, according to a study from [employer]. Keep pets out of the bedroom, and use a white-noise app to drown out snoring. 10. You Will Hit a plateau-And You Can Bust Through It As you slim down, your  body releases much less leptin, the fullness hormone.  If you're not strength training, start right now. Building muscle can raise your metabolism to help you overcome a plateau. To keep your body challenged and burning calories, incorporate new moves and more intense intervals into your workouts or add another sweat session to your weekly routine. Alternatively, cut an extra 100 calories or so a day from your diet. Now that you've lost weight, your body simply doesn't need as much fuel.   

## 2015-01-06 LAB — VITAMIN D 25 HYDROXY (VIT D DEFICIENCY, FRACTURES): VIT D 25 HYDROXY: 24 ng/mL — AB (ref 30–100)

## 2015-01-06 LAB — INSULIN, RANDOM: INSULIN: 31.8 u[IU]/mL — AB (ref 2.0–19.6)

## 2015-01-06 LAB — HEMOGLOBIN A1C
Hgb A1c MFr Bld: 5.8 % — ABNORMAL HIGH (ref <5.7)
MEAN PLASMA GLUCOSE: 120 mg/dL — AB (ref <117)

## 2015-04-03 ENCOUNTER — Encounter: Payer: Self-pay | Admitting: Internal Medicine

## 2015-04-03 ENCOUNTER — Ambulatory Visit (INDEPENDENT_AMBULATORY_CARE_PROVIDER_SITE_OTHER): Payer: BLUE CROSS/BLUE SHIELD | Admitting: Internal Medicine

## 2015-04-03 VITALS — BP 126/80 | HR 74 | Temp 98.0°F | Resp 18 | Ht 67.25 in | Wt 285.0 lb

## 2015-04-03 DIAGNOSIS — K760 Fatty (change of) liver, not elsewhere classified: Secondary | ICD-10-CM

## 2015-04-03 DIAGNOSIS — Z79899 Other long term (current) drug therapy: Secondary | ICD-10-CM | POA: Diagnosis not present

## 2015-04-03 DIAGNOSIS — R7303 Prediabetes: Secondary | ICD-10-CM

## 2015-04-03 DIAGNOSIS — F988 Other specified behavioral and emotional disorders with onset usually occurring in childhood and adolescence: Secondary | ICD-10-CM

## 2015-04-03 DIAGNOSIS — F909 Attention-deficit hyperactivity disorder, unspecified type: Secondary | ICD-10-CM

## 2015-04-03 LAB — BASIC METABOLIC PANEL WITH GFR
BUN: 15 mg/dL (ref 7–25)
CHLORIDE: 104 mmol/L (ref 98–110)
CO2: 28 mmol/L (ref 20–31)
CREATININE: 1.07 mg/dL (ref 0.60–1.35)
Calcium: 9.4 mg/dL (ref 8.6–10.3)
GFR, Est Non African American: >89 mL/min (ref >=60)
Glucose, Bld: 72 mg/dL (ref 65–99)
Potassium: 4.5 mmol/L (ref 3.5–5.3)
SODIUM: 140 mmol/L (ref 135–146)

## 2015-04-03 LAB — CBC WITH DIFFERENTIAL/PLATELET
BASOS ABS: 0 10*3/uL (ref 0.0–0.1)
BASOS PCT: 0 % (ref 0–1)
EOS ABS: 0.2 10*3/uL (ref 0.0–0.7)
Eosinophils Relative: 2 % (ref 0–5)
HCT: 45.2 % (ref 39.0–52.0)
Hemoglobin: 15.5 g/dL (ref 13.0–17.0)
LYMPHS ABS: 3 10*3/uL (ref 0.7–4.0)
LYMPHS PCT: 36 % (ref 12–46)
MCH: 28.3 pg (ref 26.0–34.0)
MCHC: 34.3 g/dL (ref 30.0–36.0)
MCV: 82.5 fL (ref 78.0–100.0)
MONO ABS: 0.7 10*3/uL (ref 0.1–1.0)
MONOS PCT: 8 % (ref 3–12)
MPV: 11 fL (ref 8.6–12.4)
Neutro Abs: 4.5 10*3/uL (ref 1.7–7.7)
Neutrophils Relative %: 54 % (ref 43–77)
PLATELETS: 255 10*3/uL (ref 150–400)
RBC: 5.48 MIL/uL (ref 4.22–5.81)
RDW: 13.5 % (ref 11.5–15.5)
WBC: 8.3 10*3/uL (ref 4.0–10.5)

## 2015-04-03 LAB — HEMOGLOBIN A1C
HEMOGLOBIN A1C: 5.8 % — AB (ref <5.7)
MEAN PLASMA GLUCOSE: 120 mg/dL — AB (ref <117)

## 2015-04-03 LAB — LIPID PANEL
CHOLESTEROL: 142 mg/dL (ref 125–200)
HDL: 34 mg/dL — AB (ref >=40)
LDL CALC: 91 mg/dL (ref <130)
TRIGLYCERIDES: 84 mg/dL (ref <150)
Total CHOL/HDL Ratio: 4.2 Ratio (ref <=5.0)
VLDL: 17 mg/dL (ref <30)

## 2015-04-03 LAB — HEPATIC FUNCTION PANEL
ALT: 67 U/L — ABNORMAL HIGH (ref 9–46)
AST: 29 U/L (ref 10–40)
Albumin: 4.3 g/dL (ref 3.6–5.1)
Alkaline Phosphatase: 61 U/L (ref 40–115)
BILIRUBIN DIRECT: 0.1 mg/dL (ref <=0.2)
BILIRUBIN TOTAL: 0.4 mg/dL (ref 0.2–1.2)
Indirect Bilirubin: 0.3 mg/dL (ref 0.2–1.2)
Total Protein: 6.9 g/dL (ref 6.1–8.1)

## 2015-04-03 LAB — TSH: TSH: 1.945 u[IU]/mL (ref 0.350–4.500)

## 2015-04-03 NOTE — Progress Notes (Signed)
Patient ID: Jason Horne, male   DOB: Jun 06, 1990, 24 y.o.   MRN: [id number]  Assessment and Plan:  Hypertension:  -Continue medication,  -monitor blood pressure at home.  -Continue DASH diet.   -Reminder to go to the ER if any CP, SOB, nausea, dizziness, severe HA, changes vision/speech, left arm numbness and tingling, and jaw pain.  Cholesterol: -Continue diet and exercise.  -Check cholesterol.   Pre-diabetes: -Continue diet and exercise.  -Check A1C  Vitamin D Def: -check level -continue medications.   Not currently on any medications.  Stressed importance of diet and exercise. Recommend every 6 month visits instead of every 3 months.  Continue diet and meds as discussed. Further disposition pending results of labs.  HPI 24 y.o. male  presents for 3 month follow up with hypertension, hyperlipidemia, prediabetes and vitamin D.   His blood pressure has been controlled at home, today their BP is BP: 126/80 mmHg.   He does workout. He denies chest pain, shortness of breath, dizziness.  He reports that he is still doing a lot of walking lately.     He is on cholesterol medication and denies myalgias. His cholesterol is at goal. The cholesterol last visit was:   Lab Results  Component Value Date   CHOL 137 01/05/2015   HDL 34* 01/05/2015   LDLCALC 89 01/05/2015   TRIG 70 01/05/2015   CHOLHDL 4.0 01/05/2015     He has been working on diet and exercise for prediabetes, and denies foot ulcerations, hyperglycemia, hypoglycemia , increased appetite, nausea, paresthesia of the feet, polydipsia, polyuria, visual disturbances, vomiting and weight loss. Last A1C in the office was:  Lab Results  Component Value Date   HGBA1C 5.8* 01/05/2015    Patient is on Vitamin D supplement.  Lab Results  Component Value Date   VD25OH 24* 01/05/2015     He reports that he is not on adderall IR for a supplement to his vyvanse as needed.  He has not had to take it yet.    He reports that  his vision has been doing very well.  He has been to the opthalmologist this year.    Current Medications:  Current Outpatient Prescriptions on File Prior to Visit  Medication Sig Dispense Refill  . lisdexamfetamine (VYVANSE) 40 MG capsule Take 40 mg by mouth every morning.     No current facility-administered medications on file prior to visit.    Medical History:  Past Medical History  Diagnosis Date  . Obese   . ADD (attention deficit disorder)   . Retinitis pigmentosa, both eyes   . Pre-diabetes   . OSA on CPAP 2014  . Retinitis pigmentosa 09/16/2013  . Fatty liver 09/16/2013  . Sleep apnea     Allergies: No Known Allergies   Review of Systems:  Review of Systems  Constitutional: Negative for fever, chills and malaise/fatigue.  HENT: Negative for congestion, ear pain and sore throat.   Eyes: Negative.   Respiratory: Negative for cough, shortness of breath and wheezing.   Cardiovascular: Negative for chest pain, palpitations and leg swelling.  Gastrointestinal: Negative for heartburn, diarrhea, constipation, blood in stool and melena.  Genitourinary: Negative.   Skin: Negative.   Neurological: Negative for dizziness, sensory change, loss of consciousness and headaches.  Psychiatric/Behavioral: Negative for depression. The patient is not nervous/anxious and does not have insomnia.     Family history- Review and unchanged  Social history- Review and unchanged  Physical Exam: BP 126/80 mmHg  Pulse 74  Temp(Src) 98 F (36.7 C) (Temporal)  Resp 18  Ht 5' 7.25" (1.708 m)  Wt 285 lb (129.275 kg)  BMI 44.31 kg/m2 Wt Readings from Last 3 Encounters:  04/03/15 285 lb (129.275 kg)  01/05/15 284 lb (128.822 kg)  09/21/14 287 lb (130.182 kg)    General Appearance: Well nourished well developed, in no apparent distress. Eyes: PERRLA, EOMs, conjunctiva no swelling or erythema ENT/Mouth: Ear canals normal without obstruction, swelling, erythma, discharge.  TMs normal  bilaterally.  Oropharynx moist, clear, without exudate, or postoropharyngeal swelling. Neck: Supple, thyroid normal,no cervical adenopathy  Respiratory: Respiratory effort normal, Breath sounds clear A&P without rhonchi, wheeze, or rale.  No retractions, no accessory usage. Cardio: RRR with no MRGs. Brisk peripheral pulses without edema.  Abdomen: Soft, + BS,  Non tender, no guarding, rebound, hernias, masses. Musculoskeletal: Full ROM, 5/5 strength, Normal gait Skin: Warm, dry without rashes, lesions, ecchymosis.  Neuro: Awake and oriented X 3, Cranial nerves intact. Normal muscle tone, no cerebellar symptoms. Psych: Normal affect, Insight and Judgment appropriate.    Starlyn Skeans, PA-C 2:50 PM [facility]

## 2015-10-05 ENCOUNTER — Encounter: Payer: Self-pay | Admitting: Internal Medicine

## 2015-10-05 ENCOUNTER — Ambulatory Visit (INDEPENDENT_AMBULATORY_CARE_PROVIDER_SITE_OTHER): Payer: BLUE CROSS/BLUE SHIELD | Admitting: Internal Medicine

## 2015-10-05 VITALS — BP 116/68 | HR 88 | Temp 98.2°F | Resp 18 | Ht 67.25 in | Wt 293.0 lb

## 2015-10-05 DIAGNOSIS — F988 Other specified behavioral and emotional disorders with onset usually occurring in childhood and adolescence: Secondary | ICD-10-CM

## 2015-10-05 DIAGNOSIS — K760 Fatty (change of) liver, not elsewhere classified: Secondary | ICD-10-CM | POA: Diagnosis not present

## 2015-10-05 DIAGNOSIS — H3552 Pigmentary retinal dystrophy: Secondary | ICD-10-CM | POA: Diagnosis not present

## 2015-10-05 DIAGNOSIS — R7303 Prediabetes: Secondary | ICD-10-CM

## 2015-10-05 DIAGNOSIS — F909 Attention-deficit hyperactivity disorder, unspecified type: Secondary | ICD-10-CM | POA: Diagnosis not present

## 2015-10-05 NOTE — Progress Notes (Signed)
Assessment and Plan:  Hypertension:  -monitor blood pressure at home.  -Continue DASH diet.   -Reminder to go to the ER if any CP, SOB, nausea, dizziness, severe HA, changes vision/speech, left arm numbness and tingling, and jaw pain.  Cholesterol: -Continue diet and exercise.   Pre-diabetes: -Continue diet and exercise.   Vitamin D Def: -check level -continue medications.   ADD -vyvanse utd -no prescription given today -will continue through summer as in summer school.    Morbid obesity -he is currently working on it with diet and exercise.    Check labs at next visit which is CPE  Continue diet and meds as discussed. Further disposition pending results of labs.  HPI 25 y.o. male  presents for 3 month follow up with hypertension, hyperlipidemia, prediabetes and vitamin D.  He reports that he has been working hard at the gym for the last 2 weeks.  He knows he is obese and wants to get some weight off.  He is going 6 days a week.  He reports that he is lifting and doing cardio together.  He is enjoying it.     His blood pressure has been controlled at home, today their BP is BP: 116/68 mmHg.   He does workout. He denies chest pain, shortness of breath, dizziness.   He is on cholesterol medication and denies myalgias. His cholesterol is at goal. The cholesterol last visit was:   Lab Results  Component Value Date   CHOL 142 04/03/2015   HDL 34* 04/03/2015   LDLCALC 91 04/03/2015   TRIG 84 04/03/2015   CHOLHDL 4.2 04/03/2015     He has been working on diet and exercise for prediabetes, and denies foot ulcerations, hyperglycemia, hypoglycemia , increased appetite, nausea, paresthesia of the feet, polydipsia, polyuria, visual disturbances, vomiting and weight loss. Last A1C in the office was:  Lab Results  Component Value Date   HGBA1C 5.8* 04/03/2015    Patient is on Vitamin D supplement.  Lab Results  Component Value Date   VD25OH 24* 01/05/2015     He reports that  his vision has been stable lately.  He has been following with opthalmology.  He has noticed no declines.    He has been taking his vyvanse and this has been helping with school.  He has been taking weekends off.  He is due to finish school in the spring.  He is currently taking summer school.   Current Medications:  Current Outpatient Prescriptions on File Prior to Visit  Medication Sig Dispense Refill  . lisdexamfetamine (VYVANSE) 40 MG capsule Take 40 mg by mouth every morning.     No current facility-administered medications on file prior to visit.    Medical History:  Past Medical History  Diagnosis Date  . Obese   . ADD (attention deficit disorder)   . Retinitis pigmentosa, both eyes   . Pre-diabetes   . OSA on CPAP 2014  . Retinitis pigmentosa 09/16/2013  . Fatty liver 09/16/2013  . Sleep apnea     Allergies: No Known Allergies   Review of Systems:  Review of Systems  Constitutional: Negative for fever, chills and malaise/fatigue.  HENT: Negative for congestion, ear pain and sore throat.   Respiratory: Negative for cough, shortness of breath and wheezing.   Cardiovascular: Negative for chest pain, palpitations and leg swelling.  Gastrointestinal: Negative for heartburn, abdominal pain, diarrhea, constipation, blood in stool and melena.  Genitourinary: Negative.   Skin: Negative.   Neurological: Negative  for dizziness, sensory change, loss of consciousness and headaches.  Psychiatric/Behavioral: Negative for depression. The patient is not nervous/anxious and does not have insomnia.     Family history- Review and unchanged  Social history- Review and unchanged  Physical Exam: BP 116/68 mmHg  Pulse 88  Temp(Src) 98.2 F (36.8 C)  Resp 18  Ht 5' 7.25" (1.708 m)  Wt 293 lb (132.904 kg)  BMI 45.56 kg/m2 Wt Readings from Last 3 Encounters:  10/05/15 293 lb (132.904 kg)  04/03/15 285 lb (129.275 kg)  01/05/15 284 lb (128.822 kg)    General Appearance: Well  nourished well developed, in no apparent distress. Eyes: PERRLA, EOMs, conjunctiva no swelling or erythema ENT/Mouth: Ear canals normal without obstruction, swelling, erythma, discharge.  TMs normal bilaterally.  Oropharynx moist, clear, without exudate, or postoropharyngeal swelling. Neck: Supple, thyroid normal,no cervical adenopathy  Respiratory: Respiratory effort normal, Breath sounds clear A&P without rhonchi, wheeze, or rale.  No retractions, no accessory usage. Cardio: RRR with no MRGs. Brisk peripheral pulses without edema.  Abdomen: Soft, + BS,  Non tender, no guarding, rebound, hernias, masses. Musculoskeletal: Full ROM, 5/5 strength, Normal gait Skin: Warm, dry without rashes, lesions, ecchymosis.  Neuro: Awake and oriented X 3, Cranial nerves intact. Normal muscle tone, no cerebellar symptoms. Psych: Normal affect, Insight and Judgment appropriate.    Starlyn Skeans, PA-C 2:47 PM [facility]

## 2016-01-08 ENCOUNTER — Encounter: Payer: Self-pay | Admitting: Internal Medicine

## 2016-01-16 ENCOUNTER — Encounter: Payer: Self-pay | Admitting: Internal Medicine

## 2016-05-29 ENCOUNTER — Encounter: Payer: Self-pay | Admitting: Internal Medicine

## 2016-05-30 ENCOUNTER — Encounter: Payer: Self-pay | Admitting: Internal Medicine

## 2016-06-04 ENCOUNTER — Ambulatory Visit (INDEPENDENT_AMBULATORY_CARE_PROVIDER_SITE_OTHER): Payer: BLUE CROSS/BLUE SHIELD | Admitting: Internal Medicine

## 2016-06-04 ENCOUNTER — Encounter: Payer: Self-pay | Admitting: Internal Medicine

## 2016-06-04 VITALS — BP 142/86 | HR 72 | Temp 98.2°F | Resp 18 | Ht 67.25 in | Wt 305.0 lb

## 2016-06-04 DIAGNOSIS — E782 Mixed hyperlipidemia: Secondary | ICD-10-CM

## 2016-06-04 DIAGNOSIS — Z1329 Encounter for screening for other suspected endocrine disorder: Secondary | ICD-10-CM

## 2016-06-04 DIAGNOSIS — Z136 Encounter for screening for cardiovascular disorders: Secondary | ICD-10-CM

## 2016-06-04 DIAGNOSIS — G4733 Obstructive sleep apnea (adult) (pediatric): Secondary | ICD-10-CM

## 2016-06-04 DIAGNOSIS — Z130 Encounter for screening for diseases of the blood and blood-forming organs and certain disorders involving the immune mechanism: Secondary | ICD-10-CM

## 2016-06-04 DIAGNOSIS — Z0000 Encounter for general adult medical examination without abnormal findings: Secondary | ICD-10-CM

## 2016-06-04 DIAGNOSIS — Z79899 Other long term (current) drug therapy: Secondary | ICD-10-CM

## 2016-06-04 DIAGNOSIS — Z0001 Encounter for general adult medical examination with abnormal findings: Secondary | ICD-10-CM

## 2016-06-04 DIAGNOSIS — Z131 Encounter for screening for diabetes mellitus: Secondary | ICD-10-CM

## 2016-06-04 DIAGNOSIS — Z1389 Encounter for screening for other disorder: Secondary | ICD-10-CM

## 2016-06-04 DIAGNOSIS — E559 Vitamin D deficiency, unspecified: Secondary | ICD-10-CM

## 2016-06-04 DIAGNOSIS — F988 Other specified behavioral and emotional disorders with onset usually occurring in childhood and adolescence: Secondary | ICD-10-CM

## 2016-06-04 LAB — HEPATIC FUNCTION PANEL
ALBUMIN: 4.4 g/dL (ref 3.6–5.1)
ALT: 62 U/L — AB (ref 9–46)
AST: 32 U/L (ref 10–40)
Alkaline Phosphatase: 60 U/L (ref 40–115)
Bilirubin, Direct: 0.1 mg/dL (ref <=0.2)
Indirect Bilirubin: 0.3 mg/dL (ref 0.2–1.2)
TOTAL PROTEIN: 6.9 g/dL (ref 6.1–8.1)
Total Bilirubin: 0.4 mg/dL (ref 0.2–1.2)

## 2016-06-04 LAB — BASIC METABOLIC PANEL WITH GFR
BUN: 16 mg/dL (ref 7–25)
CO2: 25 mmol/L (ref 20–31)
CREATININE: 1.19 mg/dL (ref 0.60–1.35)
Calcium: 9.7 mg/dL (ref 8.6–10.3)
Chloride: 105 mmol/L (ref 98–110)
GFR, EST NON AFRICAN AMERICAN: 84 mL/min (ref >=60)
Glucose, Bld: 129 mg/dL — ABNORMAL HIGH (ref 65–99)
Potassium: 4.4 mmol/L (ref 3.5–5.3)
SODIUM: 140 mmol/L (ref 135–146)

## 2016-06-04 LAB — LIPID PANEL
CHOLESTEROL: 147 mg/dL (ref <200)
HDL: 32 mg/dL — ABNORMAL LOW (ref >40)
LDL Cholesterol: 75 mg/dL (ref <100)
TRIGLYCERIDES: 201 mg/dL — AB (ref <150)
Total CHOL/HDL Ratio: 4.6 Ratio (ref <5.0)
VLDL: 40 mg/dL — ABNORMAL HIGH (ref <30)

## 2016-06-04 LAB — CBC WITH DIFFERENTIAL/PLATELET
BASOS ABS: 0 {cells}/uL (ref 0–200)
Basophils Relative: 0 %
EOS ABS: 144 {cells}/uL (ref 15–500)
Eosinophils Relative: 2 %
HEMATOCRIT: 45.8 % (ref 38.5–50.0)
Hemoglobin: 15.6 g/dL (ref 13.2–17.1)
LYMPHS PCT: 27 %
Lymphs Abs: 1944 cells/uL (ref 850–3900)
MCH: 28.7 pg (ref 27.0–33.0)
MCHC: 34.1 g/dL (ref 32.0–36.0)
MCV: 84.2 fL (ref 80.0–100.0)
MONO ABS: 360 {cells}/uL (ref 200–950)
MPV: 11.2 fL (ref 7.5–12.5)
Monocytes Relative: 5 %
NEUTROS PCT: 66 %
Neutro Abs: 4752 cells/uL (ref 1500–7800)
Platelets: 255 10*3/uL (ref 140–400)
RBC: 5.44 MIL/uL (ref 4.20–5.80)
RDW: 13.9 % (ref 11.0–15.0)
WBC: 7.2 10*3/uL (ref 3.8–10.8)

## 2016-06-04 LAB — MAGNESIUM: Magnesium: 1.9 mg/dL (ref 1.5–2.5)

## 2016-06-04 LAB — IRON AND TIBC
%SAT: 28 % (ref 15–60)
IRON: 99 ug/dL (ref 50–195)
TIBC: 359 ug/dL (ref 250–425)
UIBC: 260 ug/dL (ref 125–400)

## 2016-06-04 LAB — TSH: TSH: 1.28 m[IU]/L (ref 0.40–4.50)

## 2016-06-04 LAB — VITAMIN B12: Vitamin B-12: 373 pg/mL (ref 200–1100)

## 2016-06-04 MED ORDER — MELOXICAM 15 MG PO TABS: 15.0000 mg | ORAL_TABLET | Freq: Every day | ORAL | 1 refills | Status: DC

## 2016-06-04 NOTE — Progress Notes (Signed)
Annual Screening Comprehensive Examination   This very nice 25 y.o.male presents for complete physical.  Patient has no major health issues.  Patient reports no complaints at this time.   He reports that he is looking at doing some stuff at [employer].  He reports that he is looking for a job.    He reports that he has been working out and lifting more lately.  He has not been doing as much cardio.  He reports that he is trying to go 2-3 times per week.  He is troubled by his shin splints.  He reports that he is trying to not walk on the treadmill.  He reports that he walks on his heels.    He is currently taking vyvanse on a limited basis because he is taking online classes.  He reports that he tries to take it as minimally as possible.    Finally, patient has history of Vitamin D Deficiency and last vitamin D was  Lab Results  Component Value Date   VD25OH 24 (L) 01/05/2015  .  Currently on supplementation     Current Outpatient Prescriptions on File Prior to Visit  Medication Sig Dispense Refill  . lisdexamfetamine (VYVANSE) 40 MG capsule Take 40 mg by mouth every morning.     No current facility-administered medications on file prior to visit.     No Known Allergies  Past Medical History:  Diagnosis Date  . ADD (attention deficit disorder)   . Fatty liver 09/16/2013  . Obese   . OSA on CPAP 2014  . Pre-diabetes   . Retinitis pigmentosa 09/16/2013  . Retinitis pigmentosa, both eyes   . Sleep apnea     Immunization History  Administered Date(s) Administered  . DTaP 06/04/1991, 08/24/1991, 10/14/1991, 08/24/1992, 10/29/1996  . HPV Quadrivalent 09/27/2008, 12/10/2008, 11/01/2009  . Hepatitis A 11/02/2004, 10/07/2005  . Hepatitis B 06/04/1991, 02/10/1992, 04/28/2003  . HiB (PRP-OMP) 06/04/1991, 08/24/1991, 10/14/1991, 08/24/1992  . IPV 06/04/1991, 08/24/1991, 08/24/1992, 10/29/1996  . Influenza Nasal 12/10/2008  . Influenza Split 01/05/2015  . Influenza  Whole 12/10/2008  . MMR 08/24/1992, 10/29/1996  . Meningococcal Conjugate 10/07/2005  . Meningococcal Polysaccharide 11/01/2009  . PPD Test 09/15/2013  . Tdap 04/18/2003, 12/13/2013    Past Surgical History:  Procedure Laterality Date  . TYMPANOSTOMY    . WISDOM TOOTH EXTRACTION    . WRIST FRACTURE SURGERY Left    x 2 with bone graft    Family History  Problem Relation Age of Onset  . Adopted: Yes  . Other      Pt was adopted, no Family Hx    Social History   Social History  . Marital status: Single    Spouse name: N/A  . Number of children: 0  . Years of education: N/A   Occupational History  . College student    Social History Main Topics  . Smoking status: Never Smoker  . Smokeless tobacco: Never Used  . Alcohol use No  . Drug use: No  . Sexual activity: Not on file   Other Topics Concern  . Not on file   Social History Narrative   Single, Visual merchandiser   Lives with parents   Works at [place]  Constitutional: Negative for chills, fever and malaise/fatigue.  HENT: Negative for congestion, ear pain and sore throat.   Eyes: Negative.   Respiratory: Negative for cough, shortness of breath and wheezing.  Cardiovascular: Negative for chest pain, palpitations and leg swelling.  Gastrointestinal: Negative for abdominal pain, blood in stool, constipation, diarrhea, heartburn and melena.  Genitourinary: Negative.   Skin: Negative.   Neurological: Negative for dizziness, sensory change, loss of consciousness and headaches.  Psychiatric/Behavioral: Negative for depression. The patient is not nervous/anxious and does not have insomnia.       Physical Exam  BP (!) 142/86   Pulse 72   Temp 98.2 F (36.8 C) (Temporal)   Resp 18   Ht 5' 7.25" (1.708 m)   Wt (!) 305 lb (138.3 kg)   BMI 47.42 kg/m   General Appearance: Morbidly obese WM, Well nourished and in no apparent distress. Eyes: PERRLA, EOMs, conjunctiva no swelling or  erythema, normal fundi and vessels. Sinuses: No frontal/maxillary tenderness ENT/Mouth: EACs patent / TMs  nl. Nares clear without erythema, swelling, mucoid exudates. Oral hygiene is good. No erythema, swelling, or exudate. Tongue normal, non-obstructing. Tonsils not swollen or erythematous. Hearing normal.  Neck: Supple, thyroid normal. No bruits, nodes or JVD. Respiratory: Respiratory effort normal.  BS distant but equal and clear bilateral without rales, rhonci, wheezing or stridor. Cardio: Heart sounds are distant with regular rate and rhythm and no murmurs, rubs or gallops. Peripheral pulses are normal and equal bilaterally without edema.  Chest: symmetric with normal excursions and percussion. Abdomen: Flat, soft, with bowl sounds. Nontender, no guarding, rebound, hernias, masses, or organomegaly.  Lymphatics: Non tender without lymphadenopathy Musculoskeletal: Full ROM all peripheral extremities, joint stability, 5/5 strength, and normal gait. Skin: Warm and dry without rashes, lesions, cyanosis, clubbing or  ecchymosis.  Neuro: Cranial nerves intact, reflexes equal bilaterally. Normal muscle tone, no cerebellar symptoms. Sensation intact.  Pysch: Awake and oriented X 3, normal affect, Insight and Judgment appropriate.   Assessment and Plan   1. Encounter for general adult medical examination with abnormal findings   2. Medication management  - CBC with Differential/Platelet - Hepatic function panel - BASIC METABOLIC PANEL WITH GFR - Magnesium  3. Mixed hyperlipidemia -recommended diet and exercise - Lipid panel  4. Screening for diabetes mellitus -recommended diet and exercise -patient aware  - Hemoglobin A1c - Insulin, random  5. Screening for deficiency anemia  - Iron and TIBC - Vitamin B12  6. Screening for hematuria or proteinuria  - Urinalysis, Routine w reflex microscopic - Microalbumin / creatinine urine ratio  7. Screening for cardiovascular  condition  - EKG 12-Lead  8. Vitamin D deficiency  - VITAMIN D 25 Hydroxy (Vit-D Deficiency, Fractures)  9. Screening for thyroid disorder  - TSH  10.  ADD -followed by Rachel Moulds -cont meds per   11.  OSA -cont use of CPAP machine   Continue prudent diet as discussed, weight control, regular exercise, and medications. Routine screening labs and tests as requested with regular follow-up as recommended.  Over 40 minutes of exam, counseling, chart review and critical decision making was performed

## 2016-06-05 LAB — URINALYSIS, ROUTINE W REFLEX MICROSCOPIC
Bilirubin Urine: NEGATIVE
GLUCOSE, UA: NEGATIVE
HGB URINE DIPSTICK: NEGATIVE
KETONES UR: NEGATIVE
LEUKOCYTES UA: NEGATIVE
Nitrite: NEGATIVE
PH: 6 (ref 5.0–8.0)
Protein, ur: NEGATIVE
SPECIFIC GRAVITY, URINE: 1.027 (ref 1.001–1.035)

## 2016-06-05 LAB — MICROALBUMIN / CREATININE URINE RATIO
CREATININE, URINE: 415 mg/dL — AB (ref 20–370)
MICROALB UR: 2.1 mg/dL
Microalb Creat Ratio: 5 mcg/mg creat (ref <30)

## 2016-06-05 LAB — HEMOGLOBIN A1C
Hgb A1c MFr Bld: 5.4 % (ref <5.7)
MEAN PLASMA GLUCOSE: 108 mg/dL

## 2016-06-05 LAB — VITAMIN D 25 HYDROXY (VIT D DEFICIENCY, FRACTURES): VIT D 25 HYDROXY: 17 ng/mL — AB (ref 30–100)

## 2016-06-05 LAB — INSULIN, RANDOM: INSULIN: 649.9 u[IU]/mL — AB (ref 2.0–19.6)

## 2016-12-03 ENCOUNTER — Ambulatory Visit: Payer: Self-pay | Admitting: Internal Medicine

## 2017-01-08 ENCOUNTER — Encounter: Payer: Self-pay | Admitting: Internal Medicine

## 2017-01-16 ENCOUNTER — Encounter: Payer: Self-pay | Admitting: Internal Medicine

## 2017-03-11 DIAGNOSIS — I10 Essential (primary) hypertension: Secondary | ICD-10-CM | POA: Insufficient documentation

## 2017-03-11 NOTE — Progress Notes (Signed)
Chronic medical conditions follow up  Assessment and Plan:  Jason Horne was seen today for follow-up.  Diagnoses and all orders for this visit:  Fatty liver Long discussion about weight loss  Recommended low fat/cholesterol diet -     Hepatic function panel  Morbid obesity ([place]) Long discussion about weight loss, diet, and exercise Discussed ideal weight for height and initial weight goal (280 lb) Patient will work on appropriate caloric intake, macro split Will follow up in 6 months -     Lipid panel -     TSH  Attention deficit disorder (ADD) without hyperactivity      Continue follow up with Dr. Johnnye Sima  Pre-diabetes Discussed disease and risks Discussed diet/exercise, weight management  -     Hemoglobin A1c  Essential hypertension Currently not on medication; he reports readings are below 130/80 at home - wants to come back for recheck before starting medication Monitor blood pressure at home; call if consistently over 130/80 Discussed DASH diet Advised to go to the ER if any CP, SOB, nausea, dizziness, severe HA, changes vision/speech, left arm numbness and tingling and jaw pain. Follow up in 1 week for BP recheck - start losartan if elevated  Vitamin D deficiency -     VITAMIN D 25 Hydroxy (Vit-D Deficiency, Fractures)  Medication management -     CBC with Differential/Platelet -     BASIC METABOLIC PANEL WITH GFR  Discussed med's effects and SE's. Screening labs and tests as requested with regular follow-up as recommended. Over 30 minutes of exam, counseling, chart review and critical decision making was performed  Future Appointments  Date Time Provider [place]  03/19/2017 10:00 AM [facility] NURSE [facility] None  07/02/2017 10:00 AM Liane Comber, NP [facility] None     HPI BP (!) 142/90   Pulse 68   Temp (!) 97.3 F (36.3 C)   Ht 5' 7.25" (1.708 m)   Wt (!) 304 lb (137.9 kg)   SpO2 98%   BMI 47.26 kg/m  26 y/o male presents for follow  up has Morbid obesity ([place]); ADD (attention deficit disorder); Pre-diabetes; Retinitis pigmentosa; Fatty liver; and Hypertension on their problem list.   Patient is on an ADD medication, he states that the medication is helping and he denies any adverse reactions. He is followed by Dr. Rachel Moulds for ADD medicatons.   BMI is Body mass index is 47.26 kg/m., he has been working on diet and exercise- reportedly a Engineer, structural - has been working on diet but has been following high-fat, extremely high protein - upwards of a pound of meat daily.  Wt Readings from Last 3 Encounters:  03/12/17 (!) 304 lb (137.9 kg)  06/04/16 (!) 305 lb (138.3 kg)  10/05/15 293 lb (132.9 kg)   His blood pressure has not been controlled at home, today their BP is BP: (!) 142/90 He does workout. He denies chest pain, shortness of breath, dizziness.   He is on cholesterol medication and denies myalgias. His cholesterol is not at goal. The cholesterol last visit was:   Lab Results  Component Value Date   CHOL 147 06/04/2016   HDL 32 (L) 06/04/2016   LDLCALC 75 06/04/2016   TRIG 201 (H) 06/04/2016   CHOLHDL 4.6 06/04/2016    Last A1C in the office was:  Lab Results  Component Value Date   HGBA1C 5.4 06/04/2016   Patient is not on Vitamin D supplement.   Lab Results  Component Value Date  VD25OH 17 (L) 06/04/2016       Current Medications:  Current Outpatient Medications on File Prior to Visit  Medication Sig Dispense Refill  . amphetamine-dextroamphetamine (ADDERALL) 10 MG tablet Take 10 mg by mouth daily with breakfast.    . lisdexamfetamine (VYVANSE) 40 MG capsule Take 40 mg by mouth every morning.    . meloxicam (MOBIC) 15 MG tablet Take 1 tablet (15 mg total) by mouth daily. (Patient not taking: Reported on 03/12/2017) 30 tablet 1   No current facility-administered medications on file prior to visit.    Allergies:  No Known Allergies  Medical History:  has Morbid obesity ([place]); ADD  (attention deficit disorder); Pre-diabetes; Retinitis pigmentosa; Fatty liver; and Hypertension on their problem list. Surgical History:  He  has a past surgical history that includes Tympanostomy; Wrist fracture surgery (Left); and Wisdom tooth extraction. Family History:  His family history includes Other in his unknown relative. He was adopted. Social History:   reports that  has never smoked. he has never used smokeless tobacco. He reports that he does not drink alcohol or use drugs. Review of Systems:  Review of Systems  Constitutional: Negative for malaise/fatigue and weight loss.  HENT: Negative for hearing loss and tinnitus.   Eyes: Negative for blurred vision and double vision.  Respiratory: Negative for cough, shortness of breath and wheezing.   Cardiovascular: Negative for chest pain, palpitations, orthopnea, claudication and leg swelling.  Gastrointestinal: Negative for abdominal pain, blood in stool, constipation, diarrhea, heartburn, melena, nausea and vomiting.  Genitourinary: Negative.   Musculoskeletal: Negative for joint pain and myalgias.  Skin: Negative for rash.  Neurological: Negative for dizziness, tingling, sensory change, weakness and headaches.  Endo/Heme/Allergies: Negative for polydipsia.  Psychiatric/Behavioral: Negative.  Negative for depression. The patient is not nervous/anxious and does not have insomnia.   All other systems reviewed and are negative.   Physical Exam: Estimated body mass index is 47.26 kg/m as calculated from the following:   Height as of this encounter: 5' 7.25" (1.708 m).   Weight as of this encounter: 304 lb (137.9 kg). BP (!) 142/90   Pulse 68   Temp (!) 97.3 F (36.3 C)   Ht 5' 7.25" (1.708 m)   Wt (!) 304 lb (137.9 kg)   SpO2 98%   BMI 47.26 kg/m   General Appearance: Obese, in no apparent distress.  Eyes: PERRLA, EOMs, conjunctiva no swelling or erythema.  Sinuses: No Frontal/maxillary tenderness  ENT/Mouth: Ext aud  canals clear, normal light reflex with TMs without erythema, bulging. Good dentition. No erythema, swelling, or exudate on post pharynx. Tonsils not swollen or erythematous. Hearing normal.  Neck: Supple, thyroid normal. No bruits  Respiratory: Respiratory effort normal, BS equal bilaterally without rales, rhonchi, wheezing or stridor.  Cardio: RRR without murmurs, rubs or gallops. Brisk peripheral pulses without edema.  Chest: symmetric, with normal excursions and percussion.  Abdomen: Soft, obese abdomen, nontender, no guarding, rebound, palpable hernias, masses, or organomegaly.  Lymphatics: Non tender without lymphadenopathy.  Musculoskeletal: Full ROM all peripheral extremities,5/5 strength, and normal gait.  Skin: Warm, dry without rashes, lesions, ecchymosis. Neuro: Cranial nerves intact, reflexes equal bilaterally. Normal muscle tone, no cerebellar symptoms. Sensation intact.  Psych: Awake and oriented X 3, normal affect, Insight and Judgment appropriate.    Jason Horne 4:03 PM [facility]

## 2017-03-12 ENCOUNTER — Ambulatory Visit (INDEPENDENT_AMBULATORY_CARE_PROVIDER_SITE_OTHER): Payer: BLUE CROSS/BLUE SHIELD | Admitting: Adult Health

## 2017-03-12 ENCOUNTER — Encounter: Payer: Self-pay | Admitting: Adult Health

## 2017-03-12 VITALS — BP 142/90 | HR 68 | Temp 97.3°F | Ht 67.25 in | Wt 304.0 lb

## 2017-03-12 DIAGNOSIS — Z79899 Other long term (current) drug therapy: Secondary | ICD-10-CM

## 2017-03-12 DIAGNOSIS — Z1329 Encounter for screening for other suspected endocrine disorder: Secondary | ICD-10-CM

## 2017-03-12 DIAGNOSIS — K760 Fatty (change of) liver, not elsewhere classified: Secondary | ICD-10-CM | POA: Diagnosis not present

## 2017-03-12 DIAGNOSIS — I10 Essential (primary) hypertension: Secondary | ICD-10-CM | POA: Diagnosis not present

## 2017-03-12 DIAGNOSIS — F988 Other specified behavioral and emotional disorders with onset usually occurring in childhood and adolescence: Secondary | ICD-10-CM | POA: Diagnosis not present

## 2017-03-12 DIAGNOSIS — E559 Vitamin D deficiency, unspecified: Secondary | ICD-10-CM

## 2017-03-12 DIAGNOSIS — R7303 Prediabetes: Secondary | ICD-10-CM | POA: Diagnosis not present

## 2017-03-12 NOTE — Patient Instructions (Signed)
Look up macro splits - ratio of appropriate macro intake for muscle building -   Look into appropriate calorie intake for your height/weight/muscle building goals  80-100 fluid ounces of water daily  Aim for whole foods diet - unlimited vegetables, fruit, whole grains, beans, nuts - healthy lean proteins - choose grass fed   Here is some information to help you keep your heart healthy: Move it! - Aim for 30 mins of activity every day. Take it slowly at first. Talk to [place] before starting any new exercise program.   Lose it.  -Body Mass Index (BMI) can indicate if you need to lose weight. A healthy range is 18.5-24.9. For a BMI calculator, go to [employer]  Waist Management -Excess abdominal fat is a risk factor for heart disease, diabetes, asthma, stroke and more. Ideal waist circumference is less than 35" for women and less than 40" for men.   Eat Right -focus on fruits, vegetables, whole grains, and meals you make yourself. Avoid foods with trans fat and high sugar/sodium content.   Snooze or Snore? - Loud snoring can be a sign of sleep apnea, a significant risk factor for high blood pressure, heart attach, stroke, and heart arrhythmias.  Kick the habit -Quit Smoking! Avoid second hand smoke. A single cigarette raises your blood pressure for 20 mins and increases the risk of heart attack and stroke for the next 24 hours.   Are Aspirin and Supplements right for you? -Add ENTERIC COATED low dose 81 mg Aspirin daily OR can do every other day if you have easy bruising to protect your heart and head. As well as to reduce risk of Colon Cancer by 20 %, Skin Cancer by 26 % , Melanoma by 46% and Pancreatic cancer by 60%  Say "No to Stress -There may be little you can do about problems that cause stress. However, techniques such as long walks, meditation, and exercise can help you manage it.   Start Now! - Make changes one at a time and set reasonable goals to increase your likelihood of  success.

## 2017-03-13 LAB — BASIC METABOLIC PANEL WITH GFR
BUN: 14 mg/dL (ref 7–25)
CALCIUM: 9.7 mg/dL (ref 8.6–10.3)
CHLORIDE: 106 mmol/L (ref 98–110)
CO2: 30 mmol/L (ref 20–32)
CREATININE: 1.21 mg/dL (ref 0.60–1.35)
GFR, EST AFRICAN AMERICAN: 96 mL/min/{1.73_m2} (ref >=60)
GFR, EST NON AFRICAN AMERICAN: 83 mL/min/{1.73_m2} (ref >=60)
Glucose, Bld: 82 mg/dL (ref 65–99)
POTASSIUM: 4.5 mmol/L (ref 3.5–5.3)
SODIUM: 143 mmol/L (ref 135–146)

## 2017-03-13 LAB — CBC WITH DIFFERENTIAL/PLATELET
Basophils Absolute: 26 {cells}/uL (ref 0–200)
Basophils Relative: 0.3 %
Eosinophils Absolute: 120 {cells}/uL (ref 15–500)
Eosinophils Relative: 1.4 %
HCT: 46.4 % (ref 38.5–50.0)
Hemoglobin: 15.6 g/dL (ref 13.2–17.1)
Lymphs Abs: 2313 {cells}/uL (ref 850–3900)
MCH: 27.8 pg (ref 27.0–33.0)
MCHC: 33.6 g/dL (ref 32.0–36.0)
MCV: 82.6 fL (ref 80.0–100.0)
MPV: 11.7 fL (ref 7.5–12.5)
Monocytes Relative: 7.3 %
Neutro Abs: 5513 {cells}/uL (ref 1500–7800)
Neutrophils Relative %: 64.1 %
Platelets: 240 Thousand/uL (ref 140–400)
RBC: 5.62 Million/uL (ref 4.20–5.80)
RDW: 13.1 % (ref 11.0–15.0)
Total Lymphocyte: 26.9 %
WBC mixed population: 628 {cells}/uL (ref 200–950)
WBC: 8.6 Thousand/uL (ref 3.8–10.8)

## 2017-03-13 LAB — HEPATIC FUNCTION PANEL
AG Ratio: 1.7 (calc) (ref 1.0–2.5)
ALKALINE PHOSPHATASE (APISO): 69 U/L (ref 40–115)
ALT: 45 U/L (ref 9–46)
AST: 26 U/L (ref 10–40)
Albumin: 4.5 g/dL (ref 3.6–5.1)
BILIRUBIN INDIRECT: 0.3 mg/dL (ref 0.2–1.2)
Bilirubin, Direct: 0.1 mg/dL (ref 0.0–0.2)
GLOBULIN: 2.6 g/dL (ref 1.9–3.7)
TOTAL PROTEIN: 7.1 g/dL (ref 6.1–8.1)
Total Bilirubin: 0.4 mg/dL (ref 0.2–1.2)

## 2017-03-13 LAB — VITAMIN D 25 HYDROXY (VIT D DEFICIENCY, FRACTURES): Vit D, 25-Hydroxy: 18 ng/mL — ABNORMAL LOW (ref 30–100)

## 2017-03-13 LAB — LIPID PANEL
Cholesterol: 158 mg/dL
HDL: 36 mg/dL — ABNORMAL LOW
LDL Cholesterol (Calc): 98 mg/dL
Non-HDL Cholesterol (Calc): 122 mg/dL
Total CHOL/HDL Ratio: 4.4 (calc)
Triglycerides: 139 mg/dL

## 2017-03-13 LAB — HEMOGLOBIN A1C
Hgb A1c MFr Bld: 5.5 %{Hb}
Mean Plasma Glucose: 111 (calc)
eAG (mmol/L): 6.2 (calc)

## 2017-03-13 LAB — TSH: TSH: 1.39 mIU/L (ref 0.40–4.50)

## 2017-03-19 ENCOUNTER — Ambulatory Visit (INDEPENDENT_AMBULATORY_CARE_PROVIDER_SITE_OTHER): Payer: BLUE CROSS/BLUE SHIELD

## 2017-03-19 DIAGNOSIS — Z0130 Encounter for examination of blood pressure without abnormal findings: Secondary | ICD-10-CM

## 2017-03-19 NOTE — Progress Notes (Signed)
Patient presents to the office for a blood pressure check. Patient is consistently losing weight and diet has improved. Patient will follow up in six months.

## 2017-04-10 ENCOUNTER — Ambulatory Visit: Payer: Self-pay | Admitting: Physician Assistant

## 2017-06-04 ENCOUNTER — Encounter: Payer: Self-pay | Admitting: Internal Medicine

## 2017-06-30 NOTE — Progress Notes (Deleted)
Complete Physical  Assessment and Plan:  Diagnoses and all orders for this visit:  Encounter for general adult medical examination with abnormal findings  Essential hypertension Currently monitoring expectantly -  Monitor blood pressure at home; call if consistently over 130/80 Continue DASH diet.   Reminder to go to the ER if any CP, SOB, nausea, dizziness, severe HA, changes vision/speech, left arm numbness and tingling and jaw pain. -     EKG 12-Lead  Fatty liver Weight loss and low fat diet advised Continue to monitor -     Hepatic function panel  Attention deficit disorder (ADD) without hyperactivity Continue follow up for management by Dr. Johnnye Sima Continue medications Helps with focus, no AE's. The patient was counseled on the addictive nature of the medication and was encouraged to take drug holidays when not needed.   Morbid obesity ([place]) Long discussion about weight loss, diet, and exercise Recommended diet heavy in fruits and veggies and low in animal meats, cheeses, and dairy products, appropriate calorie intake Patient will work on *** Discussed appropriate weight for height (below***) and initial goal (***) Follow up at next visit  Pre-diabetes Recent A1Cs improved; working on weight loss Discussed disease and risks Discussed diet/exercise, weight management  -     Hemoglobin A1c -     Insulin, random  Retinitis pigmentosa ***  Vitamin D deficiency Recommend supplementation for goal of 70-100 -     VITAMIN D 25 Hydroxy (Vit-D Deficiency, Fractures)  Screening for hematuria or proteinuria -     Urinalysis w microscopic + reflex cultur -     Microalbumin / creatinine urine ratio  Screening cholesterol level -     Lipid panel  Medication management -     CBC with Differential/Platelet -     BASIC METABOLIC PANEL WITH GFR -     Hepatic function panel  Screening for thyroid disorder -     TSH  Discussed med's effects and SE's. Screening labs and  tests as requested with regular follow-up as recommended. Over 40 minutes of exam, counseling, chart review and critical decision making was performed  Future Appointments  Date Time Provider [place]  07/02/2017 10:00 AM Liane Comber, NP [facility] None  07/07/2018 10:00 AM Liane Comber, NP [facility] None     HPI Patient presents for a complete physical and has Morbid obesity ([place]); ADD (attention deficit disorder); Pre-diabetes; Retinitis pigmentosa; Fatty liver; and Hypertension on their problem list.  He is followed by Dr. Marland Kitchen for retinitis pigmentosa -   Patient is on an ADD medication, he states that the medication is helping and he denies any adverse reactions. His ADD medications are managed by Dr. Johnnye Sima-    BMI is There is no height or weight on file to calculate BMI., he {HAS HAS TOI:71245} been working on diet and exercise. Wt Readings from Last 3 Encounters:  03/19/17 298 lb (135.2 kg)  03/12/17 (!) 304 lb (137.9 kg)  06/04/16 (!) 305 lb (138.3 kg)   His blood pressure {HAS HAS NOT:18834} been controlled at home, today their BP is   He {DOES_DOES YKD:98338} workout. He denies chest pain, shortness of breath, dizziness.   He is not on cholesterol medication and denies myalgias. His cholesterol is at goal. The cholesterol last visit was:   Lab Results  Component Value Date   CHOL 158 03/12/2017   HDL 36 (L) 03/12/2017   LDLCALC 98 03/12/2017   TRIG 139 03/12/2017   CHOLHDL 4.4 03/12/2017   He {  Has/has not:18111} been working on diet and exercise for glucose management (hx of prediabetes) and denies {Symptoms; diabetes w/o none:19199}. Last A1C in the office was:  Lab Results  Component Value Date   HGBA1C 5.5 03/12/2017   Last GFR: Lab Results  Component Value Date   GFRNONAA 83 03/12/2017    Patient is not on Vitamin D supplement and was low at last check:   Lab Results  Component Value Date   VD25OH 18 (L) 03/12/2017      Current  Medications:  Current Outpatient Medications on File Prior to Visit  Medication Sig Dispense Refill  . amphetamine-dextroamphetamine (ADDERALL) 10 MG tablet Take 10 mg by mouth daily with breakfast.    . lisdexamfetamine (VYVANSE) 40 MG capsule Take 40 mg by mouth every morning.    . meloxicam (MOBIC) 15 MG tablet Take 1 tablet (15 mg total) by mouth daily. (Patient not taking: Reported on 03/12/2017) 30 tablet 1   No current facility-administered medications on file prior to visit.    Allergies:  No Known Allergies Health Maintenance:  Immunization History  Administered Date(s) Administered  . DTaP 06/04/1991, 08/24/1991, 10/14/1991, 08/24/1992, 10/29/1996  . HPV Quadrivalent 09/27/2008, 12/10/2008, 11/01/2009  . Hepatitis A 11/02/2004, 10/07/2005  . Hepatitis B 06/04/1991, 02/10/1992, 04/28/2003  . HiB (PRP-OMP) 06/04/1991, 08/24/1991, 10/14/1991, 08/24/1992  . IPV 06/04/1991, 08/24/1991, 08/24/1992, 10/29/1996  . Influenza Nasal 12/10/2008  . Influenza Split 01/05/2015  . Influenza Whole 12/10/2008  . MMR 08/24/1992, 10/29/1996  . Meningococcal Conjugate 10/07/2005  . Meningococcal Polysaccharide 11/01/2009  . PPD Test 09/15/2013  . Tdap 04/18/2003, 12/13/2013    Tetanus: 2015 Flu vaccine: *** HPV: 3/3  Colonoscopy: -  EGD: -  Eye Exam: Dentist:  Patient Care Team: Unk Pinto, MD as PCP - General (Internal Medicine) Everitt Amber, MD as Consulting Physician (Ophthalmology) Jodi Marble, MD as Consulting Physician (Otolaryngology)  Medical History:  has Morbid obesity [facility]); ADD (attention deficit disorder); Pre-diabetes; Retinitis pigmentosa; Fatty liver; and Hypertension on their problem list. Surgical History:  He  has a past surgical history that includes Tympanostomy; Wrist fracture surgery (Left); and Wisdom tooth extraction. Family History:  His family history includes Other in his unknown relative. He was adopted. Social History:   reports that   has never smoked. he has never used smokeless tobacco. He reports that he does not drink alcohol or use drugs. Review of Systems:  Review of Systems  Constitutional: Negative for malaise/fatigue and weight loss.  HENT: Negative for hearing loss and tinnitus.   Eyes: Negative for blurred vision and double vision.  Respiratory: Negative for cough, sputum production, shortness of breath and wheezing.   Cardiovascular: Negative for chest pain, palpitations, orthopnea, claudication, leg swelling and PND.  Gastrointestinal: Negative for abdominal pain, blood in stool, constipation, diarrhea, heartburn, melena, nausea and vomiting.  Genitourinary: Negative.   Musculoskeletal: Negative for falls, joint pain and myalgias.  Skin: Negative for rash.  Neurological: Negative for dizziness, tingling, sensory change, weakness and headaches.  Endo/Heme/Allergies: Negative for polydipsia.  Psychiatric/Behavioral: Negative.  Negative for depression, memory loss, substance abuse and suicidal ideas. The patient is not nervous/anxious and does not have insomnia.   All other systems reviewed and are negative.   Physical Exam: Estimated body mass index is 46.33 kg/m as calculated from the following:   Height as of 03/12/17: 5' 7.25" (1.708 m).   Weight as of 03/19/17: 298 lb (135.2 kg). There were no vitals taken for this visit. General Appearance: Well nourished, in no  apparent distress.  Eyes: PERRLA, EOMs, conjunctiva no swelling or erythema, normal fundi and vessels.  Sinuses: No Frontal/maxillary tenderness  ENT/Mouth: Ext aud canals clear, normal light reflex with TMs without erythema, bulging. Good dentition. No erythema, swelling, or exudate on post pharynx. Tonsils not swollen or erythematous. Hearing normal.  Neck: Supple, thyroid normal. No bruits  Respiratory: Respiratory effort normal, BS equal bilaterally without rales, rhonchi, wheezing or stridor.  Cardio: RRR without murmurs, rubs or gallops.  Brisk peripheral pulses without edema.  Chest: symmetric, with normal excursions and percussion.  Abdomen: Soft, nontender, no guarding, rebound, hernias, masses, or organomegaly.  Lymphatics: Non tender without lymphadenopathy.  Genitourinary:  Musculoskeletal: Full ROM all peripheral extremities,5/5 strength, and normal gait.  Skin: Warm, dry without rashes, lesions, ecchymosis. Neuro: Cranial nerves intact, reflexes equal bilaterally. Normal muscle tone, no cerebellar symptoms. Sensation intact.  Psych: Awake and oriented X 3, normal affect, Insight and Judgment appropriate.   EKG: WNL no changes.  Izora Ribas 3:32 PM [facility]

## 2017-07-02 ENCOUNTER — Encounter: Payer: Self-pay | Admitting: Adult Health

## 2018-03-16 ENCOUNTER — Encounter: Payer: Self-pay | Admitting: Adult Health

## 2018-07-07 ENCOUNTER — Encounter: Payer: Self-pay | Admitting: Adult Health

## 2021-08-10 DIAGNOSIS — R94111 Abnormal electroretinogram [ERG]: Secondary | ICD-10-CM | POA: Insufficient documentation

## 2021-08-10 DIAGNOSIS — Z135 Encounter for screening for eye and ear disorders: Secondary | ICD-10-CM | POA: Insufficient documentation

## 2021-08-10 DIAGNOSIS — H543 Unqualified visual loss, both eyes: Secondary | ICD-10-CM | POA: Insufficient documentation

## 2021-08-10 DIAGNOSIS — H53453 Other localized visual field defect, bilateral: Secondary | ICD-10-CM | POA: Insufficient documentation

## 2021-08-10 DIAGNOSIS — H5362 Acquired night blindness: Secondary | ICD-10-CM | POA: Insufficient documentation

## 2022-01-09 ENCOUNTER — Encounter: Payer: BC Managed Care – PPO | Admitting: Nurse Practitioner

## 2022-02-12 ENCOUNTER — Encounter: Payer: BC Managed Care – PPO | Admitting: Nurse Practitioner

## 2022-03-21 ENCOUNTER — Ambulatory Visit (INDEPENDENT_AMBULATORY_CARE_PROVIDER_SITE_OTHER): Payer: BC Managed Care – PPO | Admitting: Nurse Practitioner

## 2022-03-21 ENCOUNTER — Encounter: Payer: Self-pay | Admitting: Nurse Practitioner

## 2022-03-21 VITALS — BP 118/78 | HR 89 | Temp 97.8°F | Resp 16 | Ht 68.0 in | Wt 357.0 lb

## 2022-03-21 DIAGNOSIS — E559 Vitamin D deficiency, unspecified: Secondary | ICD-10-CM

## 2022-03-21 DIAGNOSIS — Z136 Encounter for screening for cardiovascular disorders: Secondary | ICD-10-CM

## 2022-03-21 DIAGNOSIS — I10 Essential (primary) hypertension: Secondary | ICD-10-CM

## 2022-03-21 DIAGNOSIS — Z0000 Encounter for general adult medical examination without abnormal findings: Secondary | ICD-10-CM

## 2022-03-21 DIAGNOSIS — F988 Other specified behavioral and emotional disorders with onset usually occurring in childhood and adolescence: Secondary | ICD-10-CM

## 2022-03-21 DIAGNOSIS — Z79899 Other long term (current) drug therapy: Secondary | ICD-10-CM | POA: Diagnosis not present

## 2022-03-21 DIAGNOSIS — Z1389 Encounter for screening for other disorder: Secondary | ICD-10-CM

## 2022-03-21 DIAGNOSIS — Z131 Encounter for screening for diabetes mellitus: Secondary | ICD-10-CM

## 2022-03-21 DIAGNOSIS — Z13220 Encounter for screening for lipoid disorders: Secondary | ICD-10-CM | POA: Diagnosis not present

## 2022-03-21 DIAGNOSIS — G4733 Obstructive sleep apnea (adult) (pediatric): Secondary | ICD-10-CM

## 2022-03-21 DIAGNOSIS — H3552 Pigmentary retinal dystrophy: Secondary | ICD-10-CM

## 2022-03-21 DIAGNOSIS — R7309 Other abnormal glucose: Secondary | ICD-10-CM

## 2022-03-21 DIAGNOSIS — Z0001 Encounter for general adult medical examination with abnormal findings: Secondary | ICD-10-CM

## 2022-03-21 DIAGNOSIS — Z1329 Encounter for screening for other suspected endocrine disorder: Secondary | ICD-10-CM

## 2022-03-21 DIAGNOSIS — K760 Fatty (change of) liver, not elsewhere classified: Secondary | ICD-10-CM

## 2022-03-21 NOTE — Patient Instructions (Signed)
Fair life protein shakes Eat more frequently - try not to go more than 6 hours without protein Aim for 90 grams of protein a day- 30 breakfast/30 lunch 30 dinner Try to keep net carbs less than 50 Net Carbs=Total Carbs-fiber- sugar alcohols Exercise heartrate 120-140(fat burning zone)- walking 20-30 minutes 4 days a week  

## 2022-03-21 NOTE — Progress Notes (Signed)
 Complete Physical  Assessment and Plan:  Jason Horne was seen today for annual exam.  Diagnoses and all orders for this visit:  Encounter for general adult medical examination with abnormal findings Due yearly  Essential hypertension Currently well controlled without medication - continue DASH diet, exercise and monitor at home. Call if greater than 130/80.  -     CBC with Differential/Platelet  Retinitis pigmentosa Continue to follow with ophthalmology  Fatty liver Avoid tylenol , alcohol. Work on diet, exercise and weight loss -     COMPLETE METABOLIC PANEL WITH GFR  Obesity, morbid, BMI 50 or higher ([place]) Fair life protein shakes Eat more frequently - try not to go more than 6 hours without protein Aim for 90 grams of protein a day- 30 breakfast/30 lunch 30 dinner Try to keep net carbs less than 50 Net Carbs=Total Carbs-fiber- sugar alcohols Exercise heartrate 120-140(fat burning zone)- walking 20-30 minutes 4 days a week  Attention deficit disorder, unspecified hyperactivity presence Continue meds and follow with Dr. Johnnye Sima  Abnormal glucose Continue diet and exercise -     Hemoglobin A1c  Vitamin D deficiency Start Vit D supplementation 5000 units daily to maintain value in therapeutic level of 60-100  -     VITAMIN D 25 Hydroxy (Vit-D Deficiency, Fractures)  Medication management -     CBC with Differential/Platelet -     COMPLETE METABOLIC PANEL WITH GFR -     Magnesium -     Lipid panel -     TSH -     Hemoglobin A1c -     VITAMIN D 25 Hydroxy (Vit-D Deficiency, Fractures) -     EKG 12-Lead -     Urinalysis, Routine w reflex microscopic  Screening for ischemic heart disease -     EKG 12-Lead  Screening for thyroid disorder -     TSH  Screening for hematuria or proteinuria -     Urinalysis, Routine w reflex microscopic  Screening for lipid disorders -     Lipid panel    Discussed med's effects and SE's. Screening labs and tests as requested with  regular follow-up as recommended. Over 40 minutes of exam, counseling, chart review and critical decision making was performed  HPI Patient presents for a complete physical. He is reestablishing care with our practice, has been over 5 years since last visit  He got a new CPAP in October and is doing regularly .    He is currently on Adderall 10 mg and is doing well.   His blood pressure has been controlled at home, today their BP is BP: 118/78 BP Readings from Last 3 Encounters:  03/21/22 118/78  03/19/17 130/82  03/12/17 (!) 142/90  He does not workout. He denies chest pain, shortness of breath, dizziness.   BMI is Body mass index is 54.28 kg/m., he has not been working on diet and exercise. He is currently not exercise as much. 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 His diet is high in carbs, limits red meat.  Eating more frozen meals He does have back pain from sitting all day. He does not drink much alcohol.  Wt Readings from Last 3 Encounters:  03/21/22 (!) 357 lb (161.9 kg)  03/19/17 298 lb (135.2 kg)  03/12/17 (!) 304 lb (137.9 kg)   He is legally blind. Retinitis pigmentosa. He stropped driving 09/9792. His mom will drive him . 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   He is not on cholesterol medication. The cholesterol last visit was:   Lab Results  Component Value Date   CHOL 158 03/12/2017   HDL 36 (L) 03/12/2017   LDLCALC 98 03/12/2017   TRIG 139 03/12/2017   CHOLHDL 4.4 03/12/2017   He has not been working on diet and exercise for abnormal glucose. Last A1C in the office was:  Lab Results  Component Value Date   HGBA1C 5.5 03/12/2017     Patient is on Vitamin D supplement.   Lab Results  Component Value Date   VD25OH 18 (L) 03/12/2017       Current Medications:  Current Outpatient Medications on File Prior to Visit  Medication Sig Dispense  Refill   amphetamine-dextroamphetamine (ADDERALL) 10 MG tablet Take 10 mg by mouth daily with breakfast.     lisdexamfetamine (VYVANSE) 40 MG capsule Take 40 mg by mouth every morning. (Patient not taking: Reported on 03/21/2022)     meloxicam (MOBIC) 15 MG tablet Take 1 tablet (15 mg total) by mouth daily. (Patient not taking: Reported on 03/12/2017) 30 tablet 1   No current facility-administered medications on file prior to visit.   Allergies:  No Known Allergies Health Maintenance:  Immunization History  Administered Date(s) Administered   DTaP 06/04/1991, 08/24/1991, 10/14/1991, 08/24/1992, 10/29/1996   HIB (PRP-OMP) 06/04/1991, 08/24/1991, 10/14/1991, 08/24/1992   HPV Quadrivalent 09/27/2008, 12/10/2008, 11/01/2009   Hepatitis A 11/02/2004, 10/07/2005   Hepatitis B 06/04/1991, 02/10/1992, 04/28/2003   IPV 06/04/1991, 08/24/1991, 08/24/1992, 10/29/1996   Influenza Nasal 12/10/2008   Influenza Split 01/05/2015   Influenza Whole 12/10/2008   MMR 08/24/1992, 10/29/1996   Meningococcal Conjugate 10/07/2005   Meningococcal Polysaccharide 11/01/2009   PPD Test 09/15/2013   Tdap 04/18/2003, 12/13/2013    Patient Care Team: Unk Pinto, MD as PCP - General (Internal Medicine) Everitt Amber, MD as Consulting Physician (Ophthalmology) Jodi Marble, MD as Consulting Physician (Otolaryngology)  Medical History:  has Morbid obesity [facility]); ADD (attention deficit disorder); Pre-diabetes; Retinitis pigmentosa; Fatty liver; and Hypertension on their problem list. Surgical History:  He  has a past surgical history that includes Tympanostomy; Wrist fracture surgery (Left); and Wisdom tooth extraction. Family History:  His family history includes Other in an other family member. He was adopted. Social History:   reports that he has never smoked. He has never used smokeless tobacco. He reports that he does not drink alcohol and does not use drugs. Review of Systems:  Review of Systems   Constitutional:  Negative for chills and fever.  HENT:  Negative for congestion, hearing loss, sinus pain, sore throat and tinnitus.   Eyes:  Negative for blurred vision and double vision.       Legally blind with cataracts  Respiratory:  Negative for cough, hemoptysis, sputum production, shortness of breath and wheezing.   Cardiovascular:  Negative for chest pain, palpitations and leg swelling.  Gastrointestinal:  Negative for abdominal pain, constipation, diarrhea, heartburn, nausea and vomiting.  Genitourinary:  Negative for dysuria and urgency.  Musculoskeletal:  Positive for back pain. Negative for falls, joint pain, myalgias and neck pain.  Skin:  Negative for rash.  Neurological:  Negative for dizziness, tingling, tremors, weakness and headaches.  Endo/Heme/Allergies:  Does not bruise/bleed easily.  Psychiatric/Behavioral:  Negative for depression and suicidal ideas. The patient is not nervous/anxious and does not have insomnia.     Physical Exam: Estimated body mass index  is 54.28 kg/m as calculated from the following:   Height as of this encounter: _0  (1.727 m).   Weight as of this encounter: 357 lb (161.9 kg). BP 118/78   Pulse 89   Temp 97.8 F (36.6 C)   Resp 16   Ht _1  (1.727 m)   Wt (!) 357 lb (161.9 kg)   SpO2 97%   BMI 54.28 kg/m  General Appearance: Morbidly obese very pleasant male in no apparent distress.  Eyes: PERRLA, EOMs, limited visual fields Sinuses: No Frontal/maxillary tenderness  ENT/Mouth: Ext aud canals clear, normal light reflex with TMs without erythema, bulging. Good dentition. No erythema, swelling, or exudate on post pharynx. Tonsils not swollen or erythematous. Hearing normal.  Neck: Supple, thyroid normal. No bruits  Respiratory: Respiratory effort normal, BS equal bilaterally without rales, rhonchi, wheezing or stridor.  Cardio: RRR without murmurs, rubs or gallops. Brisk peripheral pulses without edema.  Chest: symmetric, with normal  excursions and percussion.  Abdomen: Soft, nontender, no guarding, rebound, hernias, masses, or organomegaly.  Lymphatics: Non tender without lymphadenopathy.  Genitourinary:  Musculoskeletal: Full ROM all peripheral extremities,5/5 strength, and normal gait.  Skin: Warm, dry without rashes, lesions, ecchymosis. Neuro: Cranial nerves intact, reflexes equal bilaterally. Normal muscle tone, no cerebellar symptoms. Sensation intact.  Psych: Awake and oriented X 3, normal affect, Insight and Judgment appropriate.  EKG: NSR, no ST changes   Collan Schoenfeld E  3:25 PM [facility]

## 2022-03-22 LAB — MAGNESIUM: Magnesium: 2.1 mg/dL (ref 1.5–2.5)

## 2022-03-22 LAB — URINALYSIS, ROUTINE W REFLEX MICROSCOPIC
Bilirubin Urine: NEGATIVE
Glucose, UA: NEGATIVE
Hgb urine dipstick: NEGATIVE
Ketones, ur: NEGATIVE
Leukocytes,Ua: NEGATIVE
Nitrite: NEGATIVE
Protein, ur: NEGATIVE
Specific Gravity, Urine: 1.012 (ref 1.001–1.035)
pH: 7 (ref 5.0–8.0)

## 2022-03-22 LAB — CBC WITH DIFFERENTIAL/PLATELET
Absolute Monocytes: 620 cells/uL (ref 200–950)
Basophils Absolute: 40 cells/uL (ref 0–200)
Basophils Relative: 0.4 %
Eosinophils Absolute: 80 cells/uL (ref 15–500)
Eosinophils Relative: 0.8 %
HCT: 46 % (ref 38.5–50.0)
Hemoglobin: 15.4 g/dL (ref 13.2–17.1)
Lymphs Abs: 2450 cells/uL (ref 850–3900)
MCH: 27.6 pg (ref 27.0–33.0)
MCHC: 33.5 g/dL (ref 32.0–36.0)
MCV: 82.6 fL (ref 80.0–100.0)
MPV: 11.8 fL (ref 7.5–12.5)
Monocytes Relative: 6.2 %
Neutro Abs: 6810 cells/uL (ref 1500–7800)
Neutrophils Relative %: 68.1 %
Platelets: 266 10*3/uL (ref 140–400)
RBC: 5.57 10*6/uL (ref 4.20–5.80)
RDW: 13 % (ref 11.0–15.0)
Total Lymphocyte: 24.5 %
WBC: 10 10*3/uL (ref 3.8–10.8)

## 2022-03-22 LAB — COMPLETE METABOLIC PANEL WITH GFR
AG Ratio: 1.7 (calc) (ref 1.0–2.5)
ALT: 49 U/L — ABNORMAL HIGH (ref 9–46)
AST: 28 U/L (ref 10–40)
Albumin: 4.4 g/dL (ref 3.6–5.1)
Alkaline phosphatase (APISO): 78 U/L (ref 36–130)
BUN: 10 mg/dL (ref 7–25)
CO2: 29 mmol/L (ref 20–32)
Calcium: 10 mg/dL (ref 8.6–10.3)
Chloride: 104 mmol/L (ref 98–110)
Creat: 1 mg/dL (ref 0.60–1.26)
Globulin: 2.6 g/dL (calc) (ref 1.9–3.7)
Glucose, Bld: 82 mg/dL (ref 65–99)
Potassium: 4.2 mmol/L (ref 3.5–5.3)
Sodium: 142 mmol/L (ref 135–146)
Total Bilirubin: 0.4 mg/dL (ref 0.2–1.2)
Total Protein: 7 g/dL (ref 6.1–8.1)
eGFR: 104 mL/min/{1.73_m2} (ref >=60)

## 2022-03-22 LAB — LIPID PANEL
Cholesterol: 154 mg/dL (ref <200)
HDL: 45 mg/dL (ref >=40)
LDL Cholesterol (Calc): 92 mg/dL (calc)
Non-HDL Cholesterol (Calc): 109 mg/dL (calc) (ref <130)
Total CHOL/HDL Ratio: 3.4 (calc) (ref <5.0)
Triglycerides: 79 mg/dL (ref <150)

## 2022-03-22 LAB — VITAMIN D 25 HYDROXY (VIT D DEFICIENCY, FRACTURES): Vit D, 25-Hydroxy: 18 ng/mL — ABNORMAL LOW (ref 30–100)

## 2022-03-22 LAB — TSH: TSH: 2.19 mIU/L (ref 0.40–4.50)

## 2022-03-22 LAB — HEMOGLOBIN A1C
Hgb A1c MFr Bld: 6 % of total Hgb — ABNORMAL HIGH (ref <5.7)
Mean Plasma Glucose: 126 mg/dL
eAG (mmol/L): 7 mmol/L

## 2022-03-26 ENCOUNTER — Encounter: Payer: Self-pay | Admitting: Internal Medicine

## 2022-11-19 ENCOUNTER — Emergency Department (HOSPITAL_COMMUNITY): Payer: BLUE CROSS/BLUE SHIELD

## 2022-11-19 ENCOUNTER — Other Ambulatory Visit: Payer: Self-pay

## 2022-11-19 ENCOUNTER — Emergency Department (HOSPITAL_COMMUNITY)
Admission: EM | Admit: 2022-11-19 | Discharge: 2022-11-20 | Disposition: A | Discharge status: Home / Self Care | Payer: BLUE CROSS/BLUE SHIELD | Attending: Emergency Medicine | Admitting: Emergency Medicine

## 2022-11-19 ENCOUNTER — Encounter (HOSPITAL_COMMUNITY): Payer: Self-pay

## 2022-11-19 DIAGNOSIS — R079 Chest pain, unspecified: Secondary | ICD-10-CM | POA: Insufficient documentation

## 2022-11-19 LAB — CBC
HCT: 45.3 % (ref 39.0–52.0)
Hemoglobin: 14.7 g/dL (ref 13.0–17.0)
MCH: 27.7 pg (ref 26.0–34.0)
MCHC: 32.5 g/dL (ref 30.0–36.0)
MCV: 85.5 fL (ref 80.0–100.0)
Platelets: 233 10*3/uL (ref 150–400)
RBC: 5.3 MIL/uL (ref 4.22–5.81)
RDW: 13.6 % (ref 11.5–15.5)
WBC: 8.6 10*3/uL (ref 4.0–10.5)
nRBC: 0 % (ref 0.0–0.2)

## 2022-11-19 LAB — BASIC METABOLIC PANEL
Anion gap: 9 (ref 5–15)
BUN: 12 mg/dL (ref 6–20)
CO2: 28 mmol/L (ref 22–32)
Calcium: 9.3 mg/dL (ref 8.9–10.3)
Chloride: 104 mmol/L (ref 98–111)
Creatinine, Ser: 1.13 mg/dL (ref 0.61–1.24)
GFR, Estimated: >60 mL/min (ref >60)
Glucose, Bld: 95 mg/dL (ref 70–99)
Potassium: 4.1 mmol/L (ref 3.5–5.1)
Sodium: 141 mmol/L (ref 135–145)

## 2022-11-19 LAB — TROPONIN I (HIGH SENSITIVITY): Troponin I (High Sensitivity): 4 ng/L (ref <18)

## 2022-11-19 MED ORDER — KETOROLAC TROMETHAMINE 30 MG/ML IJ SOLN
30.0000 mg | Freq: Once | INTRAMUSCULAR | Status: AC
  Administered 2022-11-19: 30 mg via INTRAMUSCULAR
  Filled 2022-11-19: qty 1

## 2022-11-19 MED ORDER — ALUM & MAG HYDROXIDE-SIMETH 200-200-20 MG/5ML PO SUSP
30.0000 mL | Freq: Once | ORAL | Status: AC
  Administered 2022-11-19: 30 mL via ORAL
  Filled 2022-11-19: qty 30

## 2022-11-19 NOTE — ED Triage Notes (Signed)
Pt reports left sided constant chest pain that he states feels like a "gas bubble." The CP started today around noon. Denies SOB, nausea,dizziness, denies any other symptoms.

## 2022-11-19 NOTE — ED Provider Notes (Signed)
[facility]EMERGENC[facility] DEPT [facility] Emergency Department Provider Note MRN:  [id number]  Arrival date & time: 11/20/22     Chief Complaint   Chest Pain   History of Present Illness   Jason Horne is a 32 y.o. year-old male presents to the ED with chief complaint of chest pain that started at noon today.  States that the pain has been constant. Denies SOB, fever, chills, cough.  Denies ever having experienced this before.  Denies pain or swelling in legs.  States that the symptoms started after eating.  Denies hx of PE/DVT.  Denies recent long travel.  Denies any aggravating or alleviating factors.  History provided by patient.   Review of Systems  Pertinent positive and negative review of systems noted in HPI.    Physical Exam   Vitals:   11/19/22 2012 11/19/22 2330  BP: (!) 146/94 129/61  Pulse: 77 79  Resp: 20 18  Temp: 97.9 F (36.6 C)   SpO2: 100% 97%    CONSTITUTIONAL:  well-appearing, NAD NEURO:  Alert and oriented x 3, CN 3-12 grossly intact EYES:  eyes equal and reactive ENT/NECK:  Supple, no stridor  CARDIO:  normal rate, regular rhythm, appears well-perfused  PULM:  No respiratory distress, CTAB GI/GU:  non-distended, no focal abdominal tenderness MSK/SPINE:  No gross deformities, no edema, moves all extremities  SKIN:  no rash, atraumatic   *Additional and/or pertinent findings included in MDM below  Diagnostic and Interventional Summary    EKG Interpretation Date/Time:    Ventricular Rate:    PR Interval:    QRS Duration:    QT Interval:    QTC Calculation:   R Axis:      Text Interpretation:         Labs Reviewed  BASIC METABOLIC PANEL  CBC  TROPONIN I (HIGH SENSITIVITY)  TROPONIN I (HIGH SENSITIVITY)    DG Chest 2 View  Final Result      Medications  ketorolac (TORADOL) 30 MG/ML injection 30 mg (30 mg Intramuscular Given 11/19/22 2334)  alum & mag hydroxide-simeth (MAALOX/MYLANTA) 200-200-20 MG/5ML suspension 30 mL (30 mLs Oral  Given 11/19/22 2335)     Procedures  /  Critical Care Procedures  ED Course and Medical Decision Making  I have reviewed the triage vital signs, the nursing notes, and pertinent available records from the EMR.  Social Determinants Affecting Complexity of Care: Patient has no clinically significant social determinants affecting this chief complaint..   ED Course:    Medical Decision Making Patient here with chest pain that started earlier today.  Started around noon.  Pain has been constant.  Denies shortness of breath or any radiating pain.  He does state that he moved to recliner prior to onset.  Denies any reproducible symptoms.  Labs ordered in triage are reassuring.  Troponin is 4, repeat is also 4.  No acute ischemic changes on EKG.  Chest x-ray without infiltrate or opacity.  Patient treated with GI cocktail and Toradol IV, but denies much improvement.  He is low risk for ACS.  Doubt PE, PERC negative.  Feel that patient is stable for discharge and outpatient follow-up.  Will trial muscle relaxer since onset was after moving a recliner question musculoskeletal origin.  Amount and/or Complexity of Data Reviewed Labs: ordered. Radiology: ordered.  Risk OTC drugs. Prescription drug management.         Consultants: No consultations were needed in caring for this patient.   Treatment and Plan: I considered  admission due to patient's initial presentation, but after considering the examination and diagnostic results, patient will not require admission and can be discharged with outpatient follow-up.    Final Clinical Impressions(s) / ED Diagnoses     ICD-10-CM   1. Nonspecific chest pain  R07.9       ED Discharge Orders          Ordered    cyclobenzaprine (FLEXERIL) 10 MG tablet  3 times daily PRN        11/20/22 0031              Discharge Instructions Discussed with and Provided to Patient:     Discharge Instructions      No emergent cause of  your chest pain was identified tonight.  Your tests looked good.  I recommend a trial of a muscle relaxer. I've sent this to your pharmacy.  It can be sedating, so don't drive with it.  Please follow-up with your doctor. Return for new or worsening symptoms.       Roxy Horseman, PA-C 11/20/22 0040    Melene Plan, DO 11/20/22 [phone]

## 2022-11-20 MED ORDER — CYCLOBENZAPRINE HCL 10 MG PO TABS: 10.0000 mg | ORAL_TABLET | Freq: Three times a day (TID) | ORAL | 0 refills | Status: DC | PRN

## 2022-11-20 NOTE — Discharge Instructions (Addendum)
No emergent cause of your chest pain was identified tonight.  Your tests looked good.  I recommend a trial of a muscle relaxer. I've sent this to your pharmacy.  It can be sedating, so don't drive with it.  Please follow-up with your doctor. Return for new or worsening symptoms.

## 2023-01-13 ENCOUNTER — Encounter: Payer: Self-pay | Admitting: Nurse Practitioner

## 2023-03-24 NOTE — Progress Notes (Unsigned)
 Complete Physical  Assessment and Plan:  Jason Horne was seen today for annual exam.  Diagnoses and all orders for this visit:  Encounter for general adult medical examination with abnormal findings Due yearly  Essential hypertension Currently well controlled without medication - continue DASH diet, exercise and monitor at home. Call if greater than 130/80.  -     CBC with Differential/Platelet  Retinitis pigmentosa Continue to follow with ophthalmology  Fatty liver Avoid tylenol , alcohol. Work on diet, exercise and weight loss -     COMPLETE METABOLIC PANEL WITH GFR  Obesity, morbid, BMI 50 or higher ([facility]) Fair life protein shakes Eat more frequently - try not to go more than 6 hours without protein Aim for 90 grams of protein a day- 30 breakfast/30 lunch 30 dinner Try to keep net carbs less than 50 Net Carbs=Total Carbs-fiber- sugar alcohols Exercise heartrate 120-140(fat burning zone)- walking 20-30 minutes 4 days a week  Attention deficit disorder, unspecified hyperactivity presence Continue meds and follow with Dr. Elisabeth Most  Abnormal glucose Continue diet and exercise -     Hemoglobin A1c  Vitamin D deficiency Start Vit D supplementation 5000 units daily to maintain value in therapeutic level of 60-100  -     VITAMIN D 25 Hydroxy (Vit-D Deficiency, Fractures)  Medication management -     CBC with Differential/Platelet -     COMPLETE METABOLIC PANEL WITH GFR -     Magnesium -     Lipid panel -     TSH -     Hemoglobin A1c -     VITAMIN D 25 Hydroxy (Vit-D Deficiency, Fractures) -     EKG 12-Lead -     Urinalysis, Routine w reflex microscopic  Screening for ischemic heart disease -     EKG 12-Lead  Screening for thyroid disorder -     TSH  Screening for hematuria or proteinuria -     Urinalysis, Routine w reflex microscopic  Screening for lipid disorders -     Lipid panel    Discussed med's effects and SE's. Screening labs and tests as requested with  regular follow-up as recommended. Over 40 minutes of exam, counseling, chart review and critical decision making was performed  HPI Patient presents for a complete physical. He is reestablishing care with our practice, has been over 5 years since last visit  He got a new CPAP in October and is doing regularly .    He is currently on Adderall 10 mg and is doing well.   His blood pressure has been controlled at home, today their BP is   BP Readings from Last 3 Encounters:  11/19/22 129/61  03/21/22 118/78  03/19/17 130/82  He does not workout. He denies chest pain, shortness of breath, dizziness.   BMI is There is no height or weight on file to calculate BMI., he has not been working on diet and exercise. He is currently not exercise as much. 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 His diet is high in carbs, limits red meat.  Eating more frozen meals He does have back pain from sitting all day. He does not drink much alcohol.  Wt Readings from Last 3 Encounters:  11/19/22 (!) 360 lb (163.3 kg)  03/21/22 (!) 357 lb (161.9 kg)  03/19/17 298 lb (135.2 kg)   He is legally blind. Retinitis pigmentosa. He stropped driving 04/6107. His mom will drive him . 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   He is not on cholesterol medication. The cholesterol last visit was:   Lab Results  Component Value Date   CHOL 154 03/21/2022   HDL 45 03/21/2022   LDLCALC 92 03/21/2022   TRIG 79 03/21/2022   CHOLHDL 3.4 03/21/2022   He has not been working on diet and exercise for abnormal glucose. Last A1C in the office was:  Lab Results  Component Value Date   HGBA1C 6.0 (H) 03/21/2022     Patient is on Vitamin D supplement.   Lab Results  Component Value Date   VD25OH 18 (L) 03/21/2022       Current Medications:  Current Outpatient Medications on File Prior to Visit  Medication Sig  Dispense Refill   amphetamine-dextroamphetamine (ADDERALL) 10 MG tablet Take 10 mg by mouth daily with breakfast.     cyclobenzaprine (FLEXERIL) 10 MG tablet Take 1 tablet (10 mg total) by mouth 3 (three) times daily as needed for muscle spasms. 10 tablet 0   lisdexamfetamine (VYVANSE) 40 MG capsule Take 40 mg by mouth every morning. (Patient not taking: Reported on 03/21/2022)     meloxicam (MOBIC) 15 MG tablet Take 1 tablet (15 mg total) by mouth daily. (Patient not taking: Reported on 03/12/2017) 30 tablet 1   No current facility-administered medications on file prior to visit.   Allergies:  No Known Allergies Health Maintenance:  Immunization History  Administered Date(s) Administered   DTaP 06/04/1991, 08/24/1991, 10/14/1991, 08/24/1992, 10/29/1996   HIB (PRP-OMP) 06/04/1991, 08/24/1991, 10/14/1991, 08/24/1992   HPV Quadrivalent 09/27/2008, 12/10/2008, 11/01/2009   Hepatitis A 11/02/2004, 10/07/2005   Hepatitis B 06/04/1991, 02/10/1992, 04/28/2003   IPV 06/04/1991, 08/24/1991, 08/24/1992, 10/29/1996   Influenza Nasal 12/10/2008   Influenza Split 01/05/2015   Influenza Whole 12/10/2008   MMR 08/24/1992, 10/29/1996   Meningococcal Conjugate 10/07/2005   Meningococcal polysaccharide vaccine (MPSV4) 11/01/2009   PPD Test 09/15/2013   Tdap 04/18/2003, 12/13/2013    Patient Care Team: Lucky Cowboy, MD as PCP - General (Internal Medicine) Verne Carrow, MD as Consulting Physician (Ophthalmology) Flo Shanks, MD as Consulting Physician (Otolaryngology)  Medical History:  has Morbid obesity [facility]); ADD (attention deficit disorder); Pre-diabetes; Retinitis pigmentosa; Fatty liver; and Hypertension on their problem list. Surgical History:  He  has a past surgical history that includes Tympanostomy; Wrist fracture surgery (Left); and Wisdom tooth extraction. Family History:  His family history includes Other in an other family member. He was adopted. Social History:   reports  that he has never smoked. He has never used smokeless tobacco. He reports that he does not drink alcohol and does not use drugs. Review of Systems:  Review of Systems  Constitutional:  Negative for chills and fever.  HENT:  Negative for congestion, hearing loss, sinus pain, sore throat and tinnitus.   Eyes:  Negative for blurred vision and double vision.       Legally blind with cataracts  Respiratory:  Negative for cough, hemoptysis, sputum production, shortness of breath and wheezing.   Cardiovascular:  Negative for chest pain, palpitations and leg swelling.  Gastrointestinal:  Negative for abdominal pain, constipation, diarrhea, heartburn, nausea and vomiting.  Genitourinary:  Negative for dysuria and urgency.  Musculoskeletal:  Positive for back pain. Negative for falls, joint pain, myalgias and neck pain.  Skin:  Negative for rash.  Neurological:  Negative for dizziness, tingling, tremors, weakness and headaches.  Endo/Heme/Allergies:  Does not bruise/bleed easily.  Psychiatric/Behavioral:  Negative for depression and suicidal ideas. The patient is not nervous/anxious and does not have insomnia.     Physical Exam: Estimated body mass index is 54.74 kg/m as calculated from the following:   Height as of 11/19/22: 5\' 8"  (1.727 m).   Weight as of 11/19/22: 360 lb (163.3 kg). There were no vitals taken for this visit. General Appearance: Morbidly obese very pleasant male in no apparent distress.  Eyes: PERRLA, EOMs, limited visual fields Sinuses: No Frontal/maxillary tenderness  ENT/Mouth: Ext aud canals clear, normal light reflex with TMs without erythema, bulging. Good dentition. No erythema, swelling, or exudate on post pharynx. Tonsils not swollen or erythematous. Hearing normal.  Neck: Supple, thyroid normal. No bruits  Respiratory: Respiratory effort normal, BS equal bilaterally without rales, rhonchi, wheezing or stridor.  Cardio: RRR without murmurs, rubs or gallops. Brisk  peripheral pulses without edema.  Chest: symmetric, with normal excursions and percussion.  Abdomen: Soft, nontender, no guarding, rebound, hernias, masses, or organomegaly.  Lymphatics: Non tender without lymphadenopathy.  Genitourinary:  Musculoskeletal: Full ROM all peripheral extremities,5/5 strength, and normal gait.  Skin: Warm, dry without rashes, lesions, ecchymosis. Neuro: Cranial nerves intact, reflexes equal bilaterally. Normal muscle tone, no cerebellar symptoms. Sensation intact.  Psych: Awake and oriented X 3, normal affect, Insight and Judgment appropriate.  EKG: NSR, no ST changes   Remonia Otte E  12:57 PM [facility]

## 2023-03-25 ENCOUNTER — Encounter: Payer: Self-pay | Admitting: Nurse Practitioner

## 2023-03-25 DIAGNOSIS — Z79899 Other long term (current) drug therapy: Secondary | ICD-10-CM

## 2023-03-25 DIAGNOSIS — Z136 Encounter for screening for cardiovascular disorders: Secondary | ICD-10-CM

## 2023-03-25 DIAGNOSIS — Z13220 Encounter for screening for lipoid disorders: Secondary | ICD-10-CM

## 2023-03-25 DIAGNOSIS — Z1389 Encounter for screening for other disorder: Secondary | ICD-10-CM

## 2023-03-25 DIAGNOSIS — I10 Essential (primary) hypertension: Secondary | ICD-10-CM

## 2023-03-25 DIAGNOSIS — Z0001 Encounter for general adult medical examination with abnormal findings: Secondary | ICD-10-CM

## 2023-03-25 DIAGNOSIS — Z1329 Encounter for screening for other suspected endocrine disorder: Secondary | ICD-10-CM

## 2023-03-25 DIAGNOSIS — E559 Vitamin D deficiency, unspecified: Secondary | ICD-10-CM

## 2023-03-25 DIAGNOSIS — K760 Fatty (change of) liver, not elsewhere classified: Secondary | ICD-10-CM

## 2023-03-25 DIAGNOSIS — G4733 Obstructive sleep apnea (adult) (pediatric): Secondary | ICD-10-CM

## 2023-03-25 DIAGNOSIS — H3552 Pigmentary retinal dystrophy: Secondary | ICD-10-CM

## 2023-03-25 DIAGNOSIS — R7309 Other abnormal glucose: Secondary | ICD-10-CM

## 2023-05-27 ENCOUNTER — Encounter: Payer: Self-pay | Admitting: *Deleted

## 2023-12-29 DIAGNOSIS — J029 Acute pharyngitis, unspecified: Secondary | ICD-10-CM | POA: Insufficient documentation

## 2024-01-07 ENCOUNTER — Encounter: Payer: Self-pay | Admitting: Student in an Organized Health Care Education/Training Program

## 2024-01-08 ENCOUNTER — Telehealth: Payer: Self-pay

## 2024-01-08 ENCOUNTER — Ambulatory Visit: Admitting: Student in an Organized Health Care Education/Training Program

## 2024-01-08 ENCOUNTER — Other Ambulatory Visit (HOSPITAL_COMMUNITY): Payer: Self-pay

## 2024-01-08 ENCOUNTER — Encounter: Payer: Self-pay | Admitting: Student in an Organized Health Care Education/Training Program

## 2024-01-08 VITALS — BP 119/99 | HR 72 | Ht 67.0 in | Wt 385.0 lb

## 2024-01-08 DIAGNOSIS — K760 Fatty (change of) liver, not elsewhere classified: Secondary | ICD-10-CM

## 2024-01-08 DIAGNOSIS — G4733 Obstructive sleep apnea (adult) (pediatric): Secondary | ICD-10-CM

## 2024-01-08 DIAGNOSIS — Z13220 Encounter for screening for lipoid disorders: Secondary | ICD-10-CM

## 2024-01-08 DIAGNOSIS — R7303 Prediabetes: Secondary | ICD-10-CM | POA: Diagnosis not present

## 2024-01-08 DIAGNOSIS — I10 Essential (primary) hypertension: Secondary | ICD-10-CM | POA: Diagnosis not present

## 2024-01-08 DIAGNOSIS — E119 Type 2 diabetes mellitus without complications: Secondary | ICD-10-CM

## 2024-01-08 DIAGNOSIS — Z23 Encounter for immunization: Secondary | ICD-10-CM | POA: Diagnosis not present

## 2024-01-08 DIAGNOSIS — Z7985 Long-term (current) use of injectable non-insulin antidiabetic drugs: Secondary | ICD-10-CM

## 2024-01-08 LAB — MICROALBUMIN / CREATININE URINE RATIO
Creatinine,U: 360.3 mg/dL
Microalb Creat Ratio: 7 mg/g (ref 0.0–30.0)
Microalb, Ur: 2.5 mg/dL — ABNORMAL HIGH (ref 0.0–1.9)

## 2024-01-08 LAB — COMPREHENSIVE METABOLIC PANEL WITH GFR
ALT: 66 U/L — ABNORMAL HIGH (ref 0–53)
AST: 42 U/L — ABNORMAL HIGH (ref 0–37)
Albumin: 4.3 g/dL (ref 3.5–5.2)
Alkaline Phosphatase: 69 U/L (ref 39–117)
BUN: 11 mg/dL (ref 6–23)
CO2: 28 meq/L (ref 19–32)
Calcium: 9.6 mg/dL (ref 8.4–10.5)
Chloride: 103 meq/L (ref 96–112)
Creatinine, Ser: 0.99 mg/dL (ref 0.40–1.50)
GFR: 100.63 mL/min (ref >60.00)
Glucose, Bld: 108 mg/dL — ABNORMAL HIGH (ref 70–99)
Potassium: 3.7 meq/L (ref 3.5–5.1)
Sodium: 141 meq/L (ref 135–145)
Total Bilirubin: 0.8 mg/dL (ref 0.2–1.2)
Total Protein: 7.3 g/dL (ref 6.0–8.3)

## 2024-01-08 LAB — HEMOGLOBIN A1C: Hgb A1c MFr Bld: 6.5 % (ref 4.6–6.5)

## 2024-01-08 LAB — CBC
HCT: 45.7 % (ref 39.0–52.0)
Hemoglobin: 15.2 g/dL (ref 13.0–17.0)
MCHC: 33.4 g/dL (ref 30.0–36.0)
MCV: 83.7 fl (ref 78.0–100.0)
Platelets: 231 K/uL (ref 150.0–400.0)
RBC: 5.46 Mil/uL (ref 4.22–5.81)
RDW: 14.4 % (ref 11.5–15.5)
WBC: 7.1 K/uL (ref 4.0–10.5)

## 2024-01-08 LAB — LIPID PANEL
Cholesterol: 144 mg/dL (ref 0–200)
HDL: 37.1 mg/dL — ABNORMAL LOW (ref >39.00)
LDL Cholesterol: 91 mg/dL (ref 0–99)
NonHDL: 107.05
Total CHOL/HDL Ratio: 4
Triglycerides: 79 mg/dL (ref 0.0–149.0)
VLDL: 15.8 mg/dL (ref 0.0–40.0)

## 2024-01-08 MED ORDER — ZEPBOUND 2.5 MG/0.5ML ~~LOC~~ SOAJ: 2.5000 mg | SUBCUTANEOUS | 1 refills | Status: DC

## 2024-01-08 NOTE — Patient Instructions (Signed)
  VISIT SUMMARY: Today, we discussed your weight loss management, sleep apnea, and other health concerns. We reviewed your history and current health status, and we have developed a plan to help you achieve your health goals.  YOUR PLAN: -OBESITY: Obesity is a condition where you have an excessive amount of body fat. We are considering starting you on a medication called GLP-1 agonist (Zepbound ) to help with weight loss. This medication is not covered by Medicaid, but we will try to get it approved by highlighting your other health conditions. We discussed the potential side effects and how to take this medication. We will follow up in 4 weeks to see how you are doing with it.  -OBSTRUCTIVE SLEEP APNEA: Obstructive sleep apnea is a condition where your breathing stops and starts during sleep. Your condition is currently managed with a CPAP machine. The GLP-1 agonist therapy we are considering for weight loss may also help improve your sleep apnea symptoms. We will include this condition in our request for medication approval.  -NONALCOHOLIC FATTY LIVER DISEASE: Nonalcoholic fatty liver disease is a condition where fat builds up in your liver without alcohol being the cause. The GLP-1 agonist therapy may also benefit your liver health. We will include this condition in our request for medication approval.  -PREDIABETES: Prediabetes means your blood sugar levels are higher than normal but not high enough to be classified as diabetes. We will order blood work to check your current blood sugar levels and A1c.  -HYPERTENSION: Hypertension is high blood pressure. We noted that your diastolic blood pressure is elevated and will monitor this closely.  -GENERAL HEALTH MAINTENANCE: We will order blood work to check your cholesterol, liver function, blood sugars, kidney function, and electrolytes to get a comprehensive view of your health.  INSTRUCTIONS: Please follow up in 4 weeks to assess how you are  tolerating the GLP-1 agonist therapy and its effectiveness. Additionally, get the blood work done as ordered to check your cholesterol, liver function, blood sugars, kidney function, and electrolytes.

## 2024-01-08 NOTE — Assessment & Plan Note (Signed)
 Obstructive sleep apnea diagnosed about 12 years ago.  Last sleep study was in 2023, AHI elevated at 87.  He has good adherence with CPAP, but still having symptoms of daytime somnolence and poor sleep quality.  We talked about adding medication assisted treatment for sleep apnea, Zepbound  is FDA approved to treat sleep apnea with good evidence.  He has no contraindications to GLP-1 agonist.  GLP-1 agonist therapy may improve OSA symptoms.

## 2024-01-08 NOTE — Telephone Encounter (Signed)
 Pharmacy Patient Advocate Encounter  Received notification from [facility] MEDICAID that Prior Authorization for Zepbound  2.5MG /0.5ML pen-injectors  has been DENIED.  Full denial letter will be uploaded to the media tab. See denial reason below.   PA #/Case ID/Reference #: [id number]

## 2024-01-08 NOTE — Assessment & Plan Note (Signed)
 He weighs 385 lbs, BMI of 60.  He has tried diet and other lifestyle interventions without much success.  This is the heaviest he has ever weighed.  He is having complications of obesity including fatty liver disease, obstructive sleep apnea, hypertension, and prediabetes.  Prescribed GLP-1 agonist (Zepbound ). Educated him on GLP-1 agonist side effects and dosing schedule. Follow up in 4 weeks to assess medication tolerance and efficacy.

## 2024-01-08 NOTE — Assessment & Plan Note (Signed)
 New diagnosis.  A1c 6.5%.  Complicated by severe obesity and metabolic associated steatohepatitis.  Because of these comorbidities, will start treatment with Mounjaro .  ADDENDUM: insurance prefers ozempic , which will also be helpful. Will prescribe 0.25mg  weekly.

## 2024-01-08 NOTE — Telephone Encounter (Signed)
 error

## 2024-01-08 NOTE — Progress Notes (Addendum)
 New Patient Office Visit  Subjective    Patient ID: Jason Horne, male    DOB: 12/11/1990  Age: 33 y.o. MRN: [id number]  CC:  Chief Complaint  Patient presents with   Establish Care    Cataract eval on 10/10 starting with left eye.  Discuss weight loss options     HPI  Discussed the use of AI scribe software for clinical note transcription with the patient, who gave verbal consent to proceed.  History of Present Illness Jason L Isaacson is a 33 year old male with obesity and sleep apnea who presents for weight loss management.  He is currently at his heaviest weight of 385 pounds and is interested in exploring weight loss shots. 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 Previous attempts at dieting, including the keto diet, resulted in a 30-pound weight loss over two months but were unsustainable due to feeling unhealthy. He finds it difficult to adhere to diets and attributes some of his weight issues to poor sleep quality due to sleep apnea.  He has been diagnosed with sleep apnea for about 10 to 12 years, since his junior year in college, and uses a CPAP machine. He believes his sleep apnea contributes to his lethargy and difficulty in losing weight. A sleep study was conducted in 2022 to obtain a new CPAP machine.  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 He is on disability due to this condition and is scheduled for a cataract surgery consultation. He mentions that his condition runs in the family, although he is adopted.  He has a history of fatty liver, diagnosed approximately ten years ago, and is not currently on any medications for this condition. He has a history of elevated A1c and blood sugar levels, indicating prediabetes, but no known family history of diabetes.  He has a history of ADD and has been prescribed Adderall as  needed, although he is not currently taking it as he is not working. No history of tobacco, alcohol, or other drug use. No history of chest pain or shortness of breath, except when exerting himself due to being out of shape. No history of childhood asthma.   Outpatient Encounter Medications as of 01/08/2024  Medication Sig   tirzepatide  (ZEPBOUND ) 2.5 MG/0.5ML Pen Inject 2.5 mg into the skin once a week.   amphetamine-dextroamphetamine (ADDERALL) 10 MG tablet Take 10 mg by mouth daily with breakfast. (Patient not taking: Reported on 01/07/2024)   cyclobenzaprine  (FLEXERIL ) 10 MG tablet Take 1 tablet (10 mg total) by mouth 3 (three) times daily as needed for muscle spasms. (Patient not taking: Reported on 01/07/2024)   lisdexamfetamine (VYVANSE) 40 MG capsule Take 40 mg by mouth every morning. (Patient not taking: Reported on 01/07/2024)   meloxicam  (MOBIC ) 15 MG tablet Take 1 tablet (15 mg total) by mouth daily. (Patient not taking: Reported on 01/07/2024)   No facility-administered encounter medications on file as of 01/08/2024.    Past Medical History:  Diagnosis Date   ADD (attention deficit disorder)    Fatty liver 09/16/2013   Obese    OSA on CPAP 2014   Pre-diabetes    Retinitis pigmentosa 09/16/2013   Retinitis pigmentosa, both eyes    Sleep apnea     Past Surgical History:  Procedure Laterality Date   TYMPANOSTOMY     as a child  WISDOM TOOTH EXTRACTION     WRIST FRACTURE SURGERY Left    x 2 with bone graft    Family History  Adopted: Yes  Problem Relation Age of Onset   Other Other        Pt was adopted, no Family Hx       Objective    BP (!) 119/99   Pulse 72   Ht 5' 7 (1.702 m)   Wt (!) 385 lb (174.6 kg)   SpO2 98%   BMI 60.30 kg/m   Physical Exam  Gen: Well-appearing man. Ears: Normal tympanic membranes bilaterally Neck: Multiple skin tags, no nodules or adenopathy, normal thyroid , increased circumference Heart: Distant sounds, regular Lungs: Distant  sounds, unlabored, clear Abd: Obese, soft, nontender Ext: Warm, trace pitting edema in both lower extremities Neuro: Alert, conversational, decreased vision in both eyes, full strength upper and lower extremities, normal get up and go, normal gait and balance Psych: Appropriate mood and affect, not anxious or depressed at the     Assessment & Plan:    Problem List Items Addressed This Visit       High   Morbid obesity (HCC) (Chronic)   He weighs 385 lbs, BMI of 60.  He has tried diet and other lifestyle interventions without much success.  This is the heaviest he has ever weighed.  He is having complications of obesity including fatty liver disease, obstructive sleep apnea, hypertension, and prediabetes.  Prescribed GLP-1 agonist (Zepbound ). Educated him on GLP-1 agonist side effects and dosing schedule. Follow up in 4 weeks to assess medication tolerance and efficacy.      Relevant Medications   tirzepatide  (ZEPBOUND ) 2.5 MG/0.5ML Pen   Other Relevant Orders   Comprehensive metabolic panel with GFR   OSA (obstructive sleep apnea) (Chronic)   Obstructive sleep apnea diagnosed about 12 years ago.  Last sleep study was in 2023, AHI elevated at 87.  He has good adherence with CPAP, but still having symptoms of daytime somnolence and poor sleep quality.  We talked about adding medication assisted treatment for sleep apnea, Zepbound  is FDA approved to treat sleep apnea with good evidence.  He has no contraindications to GLP-1 agonist.  GLP-1 agonist therapy may improve OSA symptoms.       Relevant Medications   tirzepatide  (ZEPBOUND ) 2.5 MG/0.5ML Pen     Medium    Pre-diabetes (Chronic)   He has elevated A1c and blood sugar levels indicating prediabetes. Order blood work to assess current blood sugar levels and A1c.      Relevant Medications   tirzepatide  (ZEPBOUND ) 2.5 MG/0.5ML Pen   Other Relevant Orders   Hemoglobin A1c   Metabolic dysfunction-associated fatty liver disease  (MAFLD) (Chronic)   Diagnosed with nonalcoholic fatty liver disease in 2015. GLP-1 agonist therapy may benefit liver health. Include nonalcoholic fatty liver disease as an indication for GLP-1 agonist therapy.      Relevant Medications   tirzepatide  (ZEPBOUND ) 2.5 MG/0.5ML Pen   Other Relevant Orders   Comprehensive metabolic panel with GFR   CBC   Hypertension (Chronic)   Elevated diastolic blood pressure noted.  Working on lifestyle modifications for now.  Check kidney function, lipids, and microalbumin for total risk assessment.      Relevant Orders   Microalbumin / creatinine urine ratio   Other Visit Diagnoses       Flu vaccine need    -  Primary   Relevant Orders   Tdap vaccine greater than or equal to  7yo IM     Need for diphtheria-tetanus-pertussis (Tdap) vaccine       Relevant Orders   Flu vaccine trivalent PF, 6mos and older(Flulaval,Afluria,Fluarix,Fluzone)     Screening for lipid disorders       Relevant Orders   Lipid panel       Return in about 6 weeks (around 02/19/2024).   Cleatus Debby Specking, MD

## 2024-01-08 NOTE — Assessment & Plan Note (Signed)
 Elevated diastolic blood pressure noted.  Working on lifestyle modifications for now.  Check kidney function, lipids, and microalbumin for total risk assessment.

## 2024-01-08 NOTE — Assessment & Plan Note (Signed)
 Diagnosed with nonalcoholic fatty liver disease in 2015. GLP-1 agonist therapy may benefit liver health. Include nonalcoholic fatty liver disease as an indication for GLP-1 agonist therapy.

## 2024-01-09 ENCOUNTER — Telehealth: Payer: Self-pay

## 2024-01-09 ENCOUNTER — Other Ambulatory Visit (HOSPITAL_COMMUNITY): Payer: Self-pay

## 2024-01-09 ENCOUNTER — Ambulatory Visit: Payer: Self-pay | Admitting: Student in an Organized Health Care Education/Training Program

## 2024-01-09 MED ORDER — OZEMPIC (0.25 OR 0.5 MG/DOSE) 2 MG/3ML ~~LOC~~ SOPN
0.2500 mg | PEN_INJECTOR | SUBCUTANEOUS | 1 refills | Status: DC
Start: 2024-01-09 — End: 2024-03-03

## 2024-01-09 MED ORDER — TIRZEPATIDE 2.5 MG/0.5ML ~~LOC~~ SOAJ: 2.5000 mg | SUBCUTANEOUS | 1 refills | Status: DC

## 2024-01-09 NOTE — Addendum Note (Signed)
 Addended by: JERRELL SOLIAN T on: 01/09/2024 12:55 PM   Modules accepted: Orders

## 2024-01-09 NOTE — Telephone Encounter (Signed)
 Pharmacy Patient Advocate Encounter   Received notification from Physician's Office that prior authorization for Mounjaro  2.5MG /0.5ML auto-injectors is required/requested.   Insurance verification completed.   The patient is insured through [facility] MEDICAID .   Per test claim:  SEE BELOW is preferred by the insurance.  If suggested medication is appropriate, Please send in a new RX and discontinue this one. If not, please advise as to why it's not appropriate so that we may request a Prior Authorization. Please note, some preferred medications may still require a PA.  If the suggested medications have not been trialed and there are no contraindications to their use, the PA will not be submitted, as it will not be approved.

## 2024-01-09 NOTE — Telephone Encounter (Signed)
**Note De-identified  [place] Obfuscation** Please advise 

## 2024-01-09 NOTE — Addendum Note (Signed)
 Addended by: JERRELL SOLIAN T on: 01/09/2024 07:55 AM   Modules accepted: Orders

## 2024-01-12 ENCOUNTER — Other Ambulatory Visit (HOSPITAL_COMMUNITY): Payer: Self-pay

## 2024-01-13 ENCOUNTER — Other Ambulatory Visit (HOSPITAL_COMMUNITY): Payer: Self-pay

## 2024-01-13 ENCOUNTER — Telehealth: Payer: Self-pay

## 2024-01-13 NOTE — Telephone Encounter (Signed)
 Pharmacy Patient Advocate Encounter  Received notification from [facility] MEDICAID that Prior Authorization for Ozempic  (0.25 or 0.5 MG/DOSE) 2MG /3ML pen-injectors  has been APPROVED from 01/13/24 to 01/12/25   PA #/Case ID/Reference #: [id number]

## 2024-01-13 NOTE — Telephone Encounter (Signed)
 Pharmacy Patient Advocate Encounter   Received notification from Physician's Office that prior authorization for Ozempic  (0.25 or 0.5 MG/DOSE) 2MG /3ML pen-injectors is required/requested.   Insurance verification completed.   The patient is insured through [facility] MEDICAID .   Per test claim: PA required; PA submitted to above mentioned insurance via Latent Key/confirmation #/EOC [id number] Status is pending

## 2024-01-14 ENCOUNTER — Other Ambulatory Visit (HOSPITAL_COMMUNITY): Payer: Self-pay

## 2024-01-14 ENCOUNTER — Ambulatory Visit: Payer: Self-pay

## 2024-01-14 NOTE — Telephone Encounter (Signed)
 FYI Only or Action Required?: Action required by provider: clinical question for provider.  Patient was last seen in primary care on 01/08/2024 by [name], MD.  Called Nurse Triage reporting Advice Only.   Triage Disposition: Call PCP When Office is Open  Patient/caregiver understands and will follow disposition?: Yes    Copied from CRM #[id number]. Topic: General - Call Back - No Documentation >> Jan 14, 2024 12:37 PM Shereese L wrote: Reason for CRM: patient returning office call. Transferred to Nurse Triage   Reason for Disposition  [1] Caller requesting NON-URGENT health information AND [2] PCP's office is the best resource    Routing to PCP  Answer Assessment - Initial Assessment Questions 1. REASON FOR CALL: What is the main reason for your call? or How can I best help you?     Pt called to ask a question about Ozempic .  Pt stated he read somewhere some of these shots could cause blindness as a side effects.  Pt is wondering does Ozempic  list/state this as one of the possible side effects.  Pt wants detailed information about this medication & what he can expect with this medication.  Protocols used: Information Only Call - No Triage-A-AH

## 2024-01-14 NOTE — Telephone Encounter (Deleted)
 Copied from CRM #[id number]. Topic: Clinical - Medical Advice >> Jan 14, 2024 12:28 PM Thersia BROCKS wrote: Reason for CRM: Patient called in regarding prescription Ozempic  (0.25 or 0.5 MG/DOSE) 2MG /3ML pen-injectors , patient stated he has some questions about side effects and would like for the nurses to give him a callback regarding this

## 2024-01-14 NOTE — Telephone Encounter (Signed)
 Patient added that he has hx of retinitis stigmatosis and cataracts. Advised that message will be sent to PCP. This RN also recommended he reach out to his optho.

## 2024-01-14 NOTE — Telephone Encounter (Signed)
 Called patient to inform him of medication approval, Left Vm to return call if he has any questions or concerns.

## 2024-01-15 NOTE — Telephone Encounter (Signed)
 I spoke with the patient by phone and answered his questions.  Very small risk of vision changes with Ozempic , I think the benefits of the medicine still outweigh the risk.  Patient is going to pick it up later today.  Will follow-up with me.

## 2024-02-19 ENCOUNTER — Ambulatory Visit: Admitting: Student in an Organized Health Care Education/Training Program

## 2024-03-03 ENCOUNTER — Ambulatory Visit: Admitting: Student in an Organized Health Care Education/Training Program

## 2024-03-03 ENCOUNTER — Encounter: Payer: Self-pay | Admitting: Student in an Organized Health Care Education/Training Program

## 2024-03-03 VITALS — BP 126/77 | HR 80 | Wt 376.0 lb

## 2024-03-03 DIAGNOSIS — Z7985 Long-term (current) use of injectable non-insulin antidiabetic drugs: Secondary | ICD-10-CM

## 2024-03-03 DIAGNOSIS — Z6841 Body Mass Index (BMI) 40.0 and over, adult: Secondary | ICD-10-CM

## 2024-03-03 DIAGNOSIS — E119 Type 2 diabetes mellitus without complications: Secondary | ICD-10-CM | POA: Diagnosis not present

## 2024-03-03 MED ORDER — OZEMPIC (0.25 OR 0.5 MG/DOSE) 2 MG/3ML ~~LOC~~ SOPN: 0.5000 mg | PEN_INJECTOR | SUBCUTANEOUS | 2 refills | Status: AC

## 2024-03-03 NOTE — Progress Notes (Signed)
   Established Patient Office Visit  Patient ID: Jason Horne, male    DOB: 10-11-1990  Age: 33 y.o. MRN: [id number] PCP: [name], MD  Chief Complaint  Patient presents with   Medication Management    6 week follow up  Would like to up dosage  Diarrhea since taking the medication     Subjective:     HPI  Discussed the use of AI scribe software for clinical note transcription with the patient, who gave verbal consent to proceed.  History of Present Illness Jason Horne is a 33 year old male with diabetes who presents for follow-up on Ozempic  treatment and weight management.  He has been using Ozempic  for weight management and diabetes control, resulting in a weight loss of nine pounds over the past six weeks. He is currently on a dose of 0.25 mg. He experiences mild nausea with certain foods and diarrhea over the past couple of weeks, with bowel movements ranging from one to four times a day. Since starting Ozempic , he has not experienced symptoms of hypoglycemia, such as shaking, sweating, or feeling ravenous, which he had before starting the medication.  He underwent cataract surgery on his left eye on February 04, 2024, and is using eye drops post-surgery. He reports an unusual sensation of tasting the eye drops in his sinuses. The surgery has significantly improved his vision in the left eye. He is uncertain about proceeding with surgery on the right eye, which is scheduled for March 17, 2024. He has retinitis pigmentosa, which contributes to early onset cataracts.  He uses a CPAP machine for sleep apnea and is considering a larger nose piece for better comfort. He plans to increase his physical activity once his eye condition improves. He describes his body type as a power lifter, noting that he gains muscle mass quickly but lacks endurance.     Objective:     BP 126/77   Pulse 80   Wt (!) 376 lb (170.6 kg)   SpO2 98%   BMI 58.89 kg/m   Physical  Exam  Gen: Well-appearing young man Neck: Normal thyroid , no nodules or adenopathy Heart: Regular, no murmur Lungs: Unlabored, clear throughout    Assessment & Plan:   Problem List Items Addressed This Visit       High   Morbid obesity (HCC) (Chronic)   Chronic and improving.  Weight today is 376 pounds with a BMI 58.  He has lost about 9 pounds in the last 6 weeks which is excellent.  Plan to continue with Ozempic .  We talked about continuing with diet changes and increasing exercise.      Relevant Medications   Semaglutide ,0.25 or 0.5MG /DOS, (OZEMPIC , 0.25 OR 0.5 MG/DOSE,) 2 MG/3ML SOPN     Medium    Diabetes mellitus without complication (HCC) - Primary (Chronic)   New diagnosis last month with an A1c of 6.5%.  Associated with severe obesity and metabolic associated liver disease.  We have started treatment with Ozempic  and he is tolerating the GLP-1 agonist very well.  No issues with nausea.  Will plan to increase Ozempic  to 0.5 mg weekly.  Follow-up in 2 months for repeat A1c.      Relevant Medications   Semaglutide ,0.25 or 0.5MG /DOS, (OZEMPIC , 0.25 OR 0.5 MG/DOSE,) 2 MG/3ML SOPN    Return in about 2 months (around 05/03/2024).    Cleatus Ned Jerrell, MD [facility]

## 2024-03-03 NOTE — Assessment & Plan Note (Signed)
 Chronic and improving.  Weight today is 376 pounds with a BMI 58.  He has lost about 9 pounds in the last 6 weeks which is excellent.  Plan to continue with Ozempic .  We talked about continuing with diet changes and increasing exercise.

## 2024-03-03 NOTE — Patient Instructions (Addendum)
   VISIT SUMMARY: Today, you came in for a follow-up on your diabetes management and weight loss progress. We discussed your current treatment with Ozempic , your recent cataract surgery, and your plans for future eye surgery. We also talked about your use of a CPAP machine for sleep apnea and your plans to increase physical activity.  YOUR PLAN: -TYPE 2 DIABETES MELLITUS: Type 2 diabetes is a condition where your body does not use insulin  properly, leading to high blood sugar levels. Your symptoms have improved with Ozempic , and you have not experienced low blood sugar. We have increased your Ozempic  dose to 0.5 mg. Please monitor for any nausea or diarrhea and recheck your A1c in two months.  -MORBID OBESITY DUE TO EXCESS CALORIES: Morbid obesity is a condition of having an excessive amount of body fat. You have lost 9 pounds over the past six weeks, which is a positive sign. Continue with your current weight loss strategy, aiming to lose 1-2 pounds per week. We will keep monitoring your progress.  -STATUS POST LEFT CATARACT SURGERY: Cataract surgery involves removing the cloudy lens from your eye to improve vision. Your vision has improved after the surgery on your left eye. We will monitor your recovery and vision progress to decide whether to proceed with the right eye surgery as scheduled or postpone it.  INSTRUCTIONS: Please monitor your symptoms, especially any nausea or diarrhea, and recheck your A1c in two months. Continue with your current weight loss strategy and keep track of your progress. We will also keep an eye on your recovery from cataract surgery and decide on the right eye surgery based on your progress.

## 2024-03-03 NOTE — Assessment & Plan Note (Signed)
 New diagnosis last month with an A1c of 6.5%.  Associated with severe obesity and metabolic associated liver disease.  We have started treatment with Ozempic  and he is tolerating the GLP-1 agonist very well.  No issues with nausea.  Will plan to increase Ozempic  to 0.5 mg weekly.  Follow-up in 2 months for repeat A1c.

## 2024-03-17 ENCOUNTER — Encounter: Payer: Self-pay | Admitting: Family Medicine

## 2024-03-17 ENCOUNTER — Ambulatory Visit: Admitting: Family Medicine

## 2024-03-17 VITALS — BP 128/82 | HR 77 | Temp 97.8°F | Resp 20 | Ht 67.0 in | Wt 376.8 lb

## 2024-03-17 DIAGNOSIS — H6691 Otitis media, unspecified, right ear: Secondary | ICD-10-CM | POA: Diagnosis not present

## 2024-03-17 MED ORDER — AMOXICILLIN-POT CLAVULANATE 875-125 MG PO TABS: 1.0000 | ORAL_TABLET | Freq: Two times a day (BID) | ORAL | 0 refills | Status: DC

## 2024-03-17 NOTE — Progress Notes (Signed)
 Subjective:  Patient ID: Jason Horne, male    DOB: [birth year]  Age: 33 y.o. MRN: [id number]  CC:  Chief Complaint  Patient presents with   Ear Pain    Right ear. Pain. Cannot hear well. Sx started a week ago Tuesday. Patient did have a sinus infection - cough, ST, congestion - patient does not have any sx now besides the ear pain.     HPI Jason Horne presents for  Acute visit for above, PCP is Dr. Jerrell.  Right ear pain Pain with some decreased hearing noted 8 days ago.  Initial symptoms of sinus congestion, sore throat, cough but those have improved, now just residual right ear fullness and decreased hearing.not as painful at this time. Min cough. Hx of recurrent ear infections when younger and prior TM tubes. No recent tubes.  Tx: apple cider vinegar, hydrogen peroxide - once, no relief.    History Patient Active Problem List   Diagnosis Date Noted   OSA (obstructive sleep apnea) 01/08/2024   Sore throat 12/29/2023   Hypertension 03/11/2017   Retinitis pigmentosa 09/16/2013   Metabolic dysfunction-associated fatty liver disease (MAFLD) 09/16/2013   Morbid obesity (HCC)    ADD (attention deficit disorder)    Diabetes mellitus without complication (HCC)    Past Medical History:  Diagnosis Date   ADD (attention deficit disorder)    Fatty liver 09/16/2013   Obese    OSA on CPAP 2014   Pre-diabetes    Retinitis pigmentosa 09/16/2013   Retinitis pigmentosa, both eyes    Sleep apnea    Past Surgical History:  Procedure Laterality Date   TYMPANOSTOMY     as a child   WISDOM TOOTH EXTRACTION     WRIST FRACTURE SURGERY Left    x 2 with bone graft   No Known Allergies Prior to Admission medications   Medication Sig Start Date End Date Taking? Authorizing Provider  Semaglutide ,0.25 or 0.5MG /DOS, (OZEMPIC , 0.25 OR 0.5 MG/DOSE,) 2 MG/3ML SOPN Inject 0.5 mg into the skin once a week. 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  Constitutional:      Appearance: He is well-developed.  HENT:     Head: Normocephalic and atraumatic.     Right Ear: Ear canal and external ear normal.     Left Ear: Tympanic membrane, ear canal and external ear normal.     Ears:     Comments: Right TM injected, erythematous, dull, effusion with discolored fluid at base.  No sign of TM rupture.  Canal clear.  No pain with traction of pinna.    Nose: No rhinorrhea.     Mouth/Throat:     Pharynx: No oropharyngeal exudate or posterior oropharyngeal erythema.  Eyes:     Conjunctiva/sclera: Conjunctivae normal.     Pupils: Pupils are equal, round, and reactive to light.  Cardiovascular:     Rate and Rhythm: Normal rate and regular rhythm.     Heart sounds: Normal heart sounds. No murmur heard. Pulmonary:      Effort: Pulmonary effort is normal.     Breath sounds: Normal breath sounds. No wheezing, rhonchi or rales.  Abdominal:     Palpations: Abdomen is soft.     Tenderness: There is no abdominal tenderness.  Musculoskeletal:     Cervical back: Neck supple.  Lymphadenopathy:     Cervical: No cervical adenopathy.  Skin:    General: Skin is warm and dry.     Findings: No rash.  Neurological:     Mental Status: He is alert and oriented to person, place, and time.  Psychiatric:        Behavior: Behavior normal.     Assessment & Plan:  Jason Horne is a 33 y.o. male . Right otitis media, unspecified otitis media type - Plan: amoxicillin-clavulanate (AUGMENTIN) 875-125 MG tablet Appears to have acute right otitis media suppurative, less likely serous otitis/OME based on appearance and symptoms.  With hearing loss will treat for 10 days of Augmentin with potential side effects and risks of meds discussed.  RTC precautions given.  Meds ordered this encounter  Medications   amoxicillin-clavulanate (AUGMENTIN) 875-125 MG tablet    Sig: Take 1 tablet by mouth 2 (two) times daily.    Dispense:  20 tablet    Refill:  0   Patient Instructions  Your right ear does appear to have an middle ear infection or otitis media.  Augmentin antibiotic was ordered.  I expect symptoms to be improving in the next week or so, be seen if any new or worsening symptoms.  Take care!   Otitis Media, Adult  Otitis media occurs when there is inflammation and fluid in the middle ear with signs and symptoms of an acute infection. The middle ear is a part of the ear that contains bones for hearing as well as air that helps send sounds to the brain. When infected fluid builds up in this space, it causes pressure and can lead to an ear infection. The eustachian tube connects the middle ear to the back of the nose (nasopharynx) and normally allows air into the middle ear. If the eustachian tube becomes blocked, fluid can  build up and become infected. What are the causes? This condition is caused by a blockage in the eustachian tube. This can be caused by mucus or by swelling of the tube. Problems that can cause a blockage include: A cold or other upper respiratory infection. Allergies. An irritant, such as tobacco smoke. Enlarged adenoids. The adenoids are areas of soft tissue located high in the back  of the throat, behind the nose and the roof of the mouth. They are part of the body's defense system (immune system). A mass in the nasopharynx. Damage to the ear caused by pressure changes (barotrauma). What increases the risk? You are more likely to develop this condition if you: Smoke or are exposed to tobacco smoke. Have an opening in the roof of your mouth (cleft palate). Have gastroesophageal reflux. Have an immune system disorder. What are the signs or symptoms? Symptoms of this condition include: Ear pain. Fever. Decreased hearing. Tiredness (lethargy). Fluid leaking from the ear, if the eardrum is ruptured or has burst. Ringing in the ear. How is this diagnosed?  This condition is diagnosed with a physical exam. During the exam, your health care provider will use an instrument called an otoscope to look in your ear and check for redness, swelling, and fluid. He or she will also ask about your symptoms. Your health care provider may also order tests, such as: A pneumatic otoscopy. This is a test to check the movement of the eardrum. It is done by squeezing a small amount of air into the ear. A tympanogram. This is a test that shows how well the eardrum moves in response to air pressure in the ear canal. It provides a graph for your health care provider to review. How is this treated? This condition can go away on its own within 3-5 days. But if the condition is caused by a bacterial infection and does not go away on its own, or if it keeps coming back, your health care provider may: Prescribe  antibiotic medicine to treat the infection. Prescribe or recommend medicines to control pain. Follow these instructions at home: Take over-the-counter and prescription medicines only as told by your health care provider. If you were prescribed an antibiotic medicine, take it as told by your health care provider. Do not stop taking the antibiotic even if you start to feel better. Keep all follow-up visits. This is important. Contact a health care provider if: You have bleeding from your nose. There is a lump on your neck. You are not feeling better in 5 days. You feel worse instead of better. Get help right away if: You have severe pain that is not controlled with medicine. You have swelling, redness, or pain around your ear. You have stiffness in your neck. A part of your face is not moving (paralyzed). The bone behind your ear (mastoid bone) is tender when you touch it. You develop a severe headache. Summary Otitis media is redness, soreness, and swelling of the middle ear, usually resulting in pain and decreased hearing. This condition can go away on its own within 3-5 days. If the problem does not go away in 3-5 days, your health care provider may give you medicines to treat the infection. If you were prescribed an antibiotic medicine, take it as told by your health care provider. Follow all instructions that were given to you by your health care provider. This information is not intended to replace advice given to you by your health care provider. Make sure you discuss any questions you have with your health care provider. Document Revised: 07/10/2020 Document Reviewed: 07/10/2020 Elsevier Patient Education  2024 Elsevier Inc.    Signed,   Reyes Pines, MD [facility], [facility] 03/17/24 11:41 AM

## 2024-03-17 NOTE — Patient Instructions (Addendum)
 Your right ear does appear to have an middle ear infection or otitis media.  Augmentin antibiotic was ordered.  I expect symptoms to be improving in the next week or so, be seen if any new or worsening symptoms.  Take care!   Otitis Media, Adult  Otitis media occurs when there is inflammation and fluid in the middle ear with signs and symptoms of an acute infection. The middle ear is a part of the ear that contains bones for hearing as well as air that helps send sounds to the brain. When infected fluid builds up in this space, it causes pressure and can lead to an ear infection. The eustachian tube connects the middle ear to the back of the nose (nasopharynx) and normally allows air into the middle ear. If the eustachian tube becomes blocked, fluid can build up and become infected. What are the causes? This condition is caused by a blockage in the eustachian tube. This can be caused by mucus or by swelling of the tube. Problems that can cause a blockage include: A cold or other upper respiratory infection. Allergies. An irritant, such as tobacco smoke. Enlarged adenoids. The adenoids are areas of soft tissue located high in the back of the throat, behind the nose and the roof of the mouth. They are part of the body's defense system (immune system). A mass in the nasopharynx. Damage to the ear caused by pressure changes (barotrauma). What increases the risk? You are more likely to develop this condition if you: Smoke or are exposed to tobacco smoke. Have an opening in the roof of your mouth (cleft palate). Have gastroesophageal reflux. Have an immune system disorder. What are the signs or symptoms? Symptoms of this condition include: Ear pain. Fever. Decreased hearing. Tiredness (lethargy). Fluid leaking from the ear, if the eardrum is ruptured or has burst. Ringing in the ear. How is this diagnosed?  This condition is diagnosed with a physical exam. During the exam, your health care  provider will use an instrument called an otoscope to look in your ear and check for redness, swelling, and fluid. He or she will also ask about your symptoms. Your health care provider may also order tests, such as: A pneumatic otoscopy. This is a test to check the movement of the eardrum. It is done by squeezing a small amount of air into the ear. A tympanogram. This is a test that shows how well the eardrum moves in response to air pressure in the ear canal. It provides a graph for your health care provider to review. How is this treated? This condition can go away on its own within 3-5 days. But if the condition is caused by a bacterial infection and does not go away on its own, or if it keeps coming back, your health care provider may: Prescribe antibiotic medicine to treat the infection. Prescribe or recommend medicines to control pain. Follow these instructions at home: Take over-the-counter and prescription medicines only as told by your health care provider. If you were prescribed an antibiotic medicine, take it as told by your health care provider. Do not stop taking the antibiotic even if you start to feel better. Keep all follow-up visits. This is important. Contact a health care provider if: You have bleeding from your nose. There is a lump on your neck. You are not feeling better in 5 days. You feel worse instead of better. Get help right away if: You have severe pain that is not controlled with medicine.  You have swelling, redness, or pain around your ear. You have stiffness in your neck. A part of your face is not moving (paralyzed). The bone behind your ear (mastoid bone) is tender when you touch it. You develop a severe headache. Summary Otitis media is redness, soreness, and swelling of the middle ear, usually resulting in pain and decreased hearing. This condition can go away on its own within 3-5 days. If the problem does not go away in 3-5 days, your health care  provider may give you medicines to treat the infection. If you were prescribed an antibiotic medicine, take it as told by your health care provider. Follow all instructions that were given to you by your health care provider. This information is not intended to replace advice given to you by your health care provider. Make sure you discuss any questions you have with your health care provider. Document Revised: 07/10/2020 Document Reviewed: 07/10/2020 Elsevier Patient Education  2024 [employer].

## 2024-03-25 ENCOUNTER — Encounter: Payer: Self-pay | Admitting: Nurse Practitioner

## 2024-04-06 ENCOUNTER — Ambulatory Visit: Admitting: Family Medicine

## 2024-04-14 ENCOUNTER — Ambulatory Visit: Payer: Self-pay

## 2024-04-14 NOTE — Telephone Encounter (Signed)
 FYI Only or Action Required?: FYI only for provider: appointment scheduled on 04/16/24 and requesting anabiotics.  Patient was last seen in primary care on 03/17/2024 by Levora Reyes SAUNDERS, MD.  Called Nurse Triage reporting Otalgia.  Symptoms began yesterday.  Interventions attempted: Other: hydrogen peroxide and apple cider vinegar.  Symptoms are: gradually worsening.  Triage Disposition: Home Care  Patient/caregiver understands and will follow disposition?:   Reason for Disposition  Earache lasts < 60 minutes  Answer Assessment - Initial Assessment Questions Pt calling to report ear pain, onset 04/12/24, pain 5/10. Ear infx about a month ago. Denies discharge from ear.   Pt using hydrogen peroxide and apple cider vingar. Pt states he has had many ear infections in the past and is wanting to get an antibiotic. Pt scheduled for OV 04/16/24.  1. LOCATION: Which ear is involved?     Right ear  2. ONSET: When did the ear pain start?      04/12/24 3. SEVERITY: How bad is the pain?  (Scale 1-10; mild, moderate or severe)     5/10 4. URI SYMPTOMS: Do you have a runny nose or cough?     Denies  5. FEVER: Do you have a fever? If Yes, ask: What is your temperature, how was it measured, and when did it start?     Denies  6. CAUSE: Have you been swimming recently?, How often do you use Q-TIPS?, Have you had any recent air travel or scuba diving?     Denies  7. OTHER SYMPTOMS: Do you have any other symptoms? (e.g., decreased hearing, dizziness, headache, stiff neck, vomiting)     Denies  Protocols used: Earache-A-AH  Copied from CRM #[id number]. Topic: Clinical - Red Word Triage >> Apr 14, 2024  8:32 AM Adelita E wrote: [facility] that prompted transfer to Nurse Triage: Potential ear infection. Patient has been having right ear pain and it is also swollen, started yesterday 12/30.

## 2024-04-14 NOTE — Telephone Encounter (Signed)
 FYI patient has appt Friday

## 2024-04-16 ENCOUNTER — Encounter: Payer: Self-pay | Admitting: Student in an Organized Health Care Education/Training Program

## 2024-04-16 ENCOUNTER — Ambulatory Visit: Admitting: Student in an Organized Health Care Education/Training Program

## 2024-04-16 VITALS — BP 127/72 | HR 86 | Temp 99.0°F | Ht 67.0 in | Wt 377.6 lb

## 2024-04-16 DIAGNOSIS — H66004 Acute suppurative otitis media without spontaneous rupture of ear drum, recurrent, right ear: Secondary | ICD-10-CM | POA: Diagnosis not present

## 2024-04-16 DIAGNOSIS — H6691 Otitis media, unspecified, right ear: Secondary | ICD-10-CM | POA: Insufficient documentation

## 2024-04-16 DIAGNOSIS — H3552 Pigmentary retinal dystrophy: Secondary | ICD-10-CM | POA: Diagnosis not present

## 2024-04-16 DIAGNOSIS — H6690 Otitis media, unspecified, unspecified ear: Secondary | ICD-10-CM | POA: Insufficient documentation

## 2024-04-16 MED ORDER — CEFDINIR 300 MG PO CAPS: 300.0000 mg | ORAL_CAPSULE | Freq: Two times a day (BID) | ORAL | 0 refills | Status: AC

## 2024-04-16 NOTE — Patient Instructions (Addendum)
" °  VISIT SUMMARY: Today, you were seen for right ear pain that started three days ago. You have a history of recurrent ear infections, and this is your third infection in the past few months. You recently had a slight head cold, and the ear pain began after those symptoms subsided. We discussed your history of ear infections and previous treatments.  YOUR PLAN: -RECURRENT ACUTE SUPPURATIVE OTITIS MEDIA OF RIGHT EAR: This is a recurring bacterial infection of the middle ear that causes pain and swelling. We noted significant redness and swelling in your right ear. Since your previous antibiotic treatment was not effective, we are prescribing cefdinir 300 mg, one tablet twice daily for 7 days. Additionally, we recommend using sinus irrigation and intranasal steroids during any future upper respiratory infections to help prevent further ear infections.  INSTRUCTIONS: Please take the prescribed cefdinir as directed and complete the full course of antibiotics. Use sinus irrigation and intranasal steroids during any future colds or sinus infections. If your symptoms do not improve or worsen, please schedule a follow-up appointment.    "

## 2024-04-16 NOTE — Progress Notes (Signed)
 "  Acute Office Visit  Patient ID: Buford L Jerome, male    DOB: Jun 04, 1990, 34 y.o.   MRN: [id number]  PCP: [name], MD  Chief Complaint  Patient presents with   Ear Pain    Pt reports ear pain on R side since 04/12/2024. He reports he has had several ear infection in the past. Denied chills, fever. Slightly dizzy.      Subjective:     HPI  Discussed the use of AI scribe software for clinical note transcription with the patient, who gave verbal consent to proceed.  History of Present Illness Other L Cazarez is a 34 year old male with recurrent ear infections who presents with right ear pain.  He has been experiencing right ear pain that began three days ago. He has a long history of ear infections due to anatomical issues affecting ear drainage. He completed a course of antibiotics a little over a month ago, which initially resolved the issue, but the pain has recurred. He has had three ear infections in the past three to four months.  He describes using a home remedy of hydrogen peroxide and apple cider vinegar, which provided temporary relief for about 24 hours. He also had a slight head cold two weeks ago, with symptoms of sinus congestion and a mild fever, but no significant cough or nasal discharge. The ear infection developed after most of the cold symptoms had subsided.  His past medical history includes having tubes placed in his ears three times during infancy and toddlerhood. He also had cataract surgery on his left eye in late October, after which he used prescribed eye drops that drained into his sinuses.  He has no known allergies to medications.  He is visually impaired and does not drive, relying on his wife for transportation. He is considering getting a guide dog.      Objective:    BP 127/72 (BP Location: Right Arm, Patient Position: Sitting, Cuff Size: Large)   Pulse 86   Temp 99 F (37.2 C) (Oral)   Ht 5' 7 (1.702 m)   Wt (!) 377 lb 9.6  oz (171.3 kg)   SpO2 98%   BMI 59.14 kg/m   Physical Exam  Gen: Well-appearing man Ears: Left tympanic membrane is normal, right ear canal is normal with no cerumen, the right tympanic membrane is very erythematous, no perforation, small atelectasis, cannot see a middle ear effusion mostly because the tympanic membrane is so erythematous. Mouth: Enlarged bilateral adenoids with no exudates Neck: No tender cervical adenopathy Heart: Regular, no murmur Lungs: Unlabored, no crackles or wheezing      Assessment & Plan:   Problem List Items Addressed This Visit       High   Retinitis pigmentosa (Chronic)   Chronic and stable.  Patient requested handicap placard for parking due to his severe vision deficits.  He is on permanent disability for vision impairment, does not drive, but would find it beneficial for his wife to be able to park closer.  I provided the placard application.        Unprioritized   Otitis media - Primary   Recurrent episodes with ineffective response to Augmentin  suggest possible bacterial resistance. Significant redness and swelling are noted of the TM. Tympanostomy tubes were discussed but are not preferred. Prescribed cefdinir 300 mg, one tablet twice daily for 7 days. Advised sinus irrigation and intranasal steroids during upper respiratory infections. Continue current management without surgical intervention.  If he  continues to have issues with recurrent otitis media, would recommend consultation with ENT to consider tympanostomy with or without tonsillectomy to try to lower his future risk of OM infections.      Relevant Medications   cefdinir (OMNICEF) 300 MG capsule    Meds ordered this encounter  Medications   cefdinir (OMNICEF) 300 MG capsule    Sig: Take 1 capsule (300 mg total) by mouth 2 (two) times daily for 7 days.    Dispense:  14 capsule    Refill:  0    No follow-ups on file.  Cleatus Debby Specking, MD [facility]  [facility]   "

## 2024-04-16 NOTE — Assessment & Plan Note (Signed)
 Chronic and stable.  Patient requested handicap placard for parking due to his severe vision deficits.  He is on permanent disability for vision impairment, does not drive, but would find it beneficial for his wife to be able to park closer.  I provided the placard application.

## 2024-04-16 NOTE — Assessment & Plan Note (Signed)
 Recurrent episodes with ineffective response to Augmentin  suggest possible bacterial resistance. Significant redness and swelling are noted of the TM. Tympanostomy tubes were discussed but are not preferred. Prescribed cefdinir 300 mg, one tablet twice daily for 7 days. Advised sinus irrigation and intranasal steroids during upper respiratory infections. Continue current management without surgical intervention.  If he continues to have issues with recurrent otitis media, would recommend consultation with ENT to consider tympanostomy with or without tonsillectomy to try to lower his future risk of OM infections.

## 2024-04-26 ENCOUNTER — Encounter: Payer: Self-pay | Admitting: Student in an Organized Health Care Education/Training Program

## 2024-04-26 ENCOUNTER — Ambulatory Visit: Admitting: Student in an Organized Health Care Education/Training Program

## 2024-04-26 VITALS — BP 124/73 | HR 81 | Temp 98.6°F | Ht 68.0 in | Wt 373.0 lb

## 2024-04-26 DIAGNOSIS — T2112XA Burn of first degree of abdominal wall, initial encounter: Secondary | ICD-10-CM | POA: Insufficient documentation

## 2024-04-26 DIAGNOSIS — Z7985 Long-term (current) use of injectable non-insulin antidiabetic drugs: Secondary | ICD-10-CM

## 2024-04-26 DIAGNOSIS — Z1283 Encounter for screening for malignant neoplasm of skin: Secondary | ICD-10-CM

## 2024-04-26 DIAGNOSIS — Z23 Encounter for immunization: Secondary | ICD-10-CM | POA: Diagnosis not present

## 2024-04-26 DIAGNOSIS — H66004 Acute suppurative otitis media without spontaneous rupture of ear drum, recurrent, right ear: Secondary | ICD-10-CM | POA: Diagnosis not present

## 2024-04-26 DIAGNOSIS — E119 Type 2 diabetes mellitus without complications: Secondary | ICD-10-CM | POA: Diagnosis not present

## 2024-04-26 LAB — POCT GLYCOSYLATED HEMOGLOBIN (HGB A1C): HbA1c, POC (controlled diabetic range): 5.5 % (ref 0.0–7.0)

## 2024-04-26 NOTE — Assessment & Plan Note (Signed)
 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 No further antibiotics are needed. Discussed potential tympanostomy tubes with ENT. Referred to ENT for evaluation and discussion of tympanostomy tubes. Advised Flonase for sinus congestion to aid ear drainage.
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
## 2024-04-26 NOTE — Assessment & Plan Note (Signed)
 His type 2 diabetes is well-controlled with Ozempic  0.5 mg. A1c is at 5.5%, nicely improved from 6.5% 4 months ago. Continue Ozempic  0.5 mg. Scheduled follow-up in three months.

## 2024-04-26 NOTE — Assessment & Plan Note (Signed)
 The burn is healing well with no infection. Advised washing with a washcloth to maintain moisture and prevent scabbing.
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
## 2024-05-03 ENCOUNTER — Ambulatory Visit: Admitting: Student in an Organized Health Care Education/Training Program

## 2024-07-27 ENCOUNTER — Ambulatory Visit: Admitting: Student in an Organized Health Care Education/Training Program

## 2025-01-11 ENCOUNTER — Ambulatory Visit: Admitting: Physician Assistant
